# Patient Record
Sex: Female | Born: 1955 | Race: White | Hispanic: No
Health system: Southern US, Community
[De-identification: ages and names within clinical notes are randomized; demographics above are authoritative.]

---

## 2000-01-28 ENCOUNTER — Encounter: Payer: Self-pay | Admitting: Obstetrics and Gynecology

## 2000-01-28 ENCOUNTER — Encounter: Admission: RE | Admit: 2000-01-28 | Discharge: 2000-01-28 | Payer: Self-pay | Admitting: Obstetrics and Gynecology

## 2001-10-19 ENCOUNTER — Encounter: Payer: Self-pay | Admitting: Obstetrics and Gynecology

## 2001-10-19 ENCOUNTER — Encounter: Admission: RE | Admit: 2001-10-19 | Discharge: 2001-10-19 | Payer: Self-pay | Admitting: Obstetrics and Gynecology

## 2002-11-09 ENCOUNTER — Encounter: Payer: Self-pay | Admitting: Obstetrics and Gynecology

## 2002-11-09 ENCOUNTER — Encounter: Admission: RE | Admit: 2002-11-09 | Discharge: 2002-11-09 | Payer: Self-pay | Admitting: Obstetrics and Gynecology

## 2002-11-22 ENCOUNTER — Encounter: Payer: Self-pay | Admitting: Obstetrics and Gynecology

## 2002-11-22 ENCOUNTER — Encounter: Admission: RE | Admit: 2002-11-22 | Discharge: 2002-11-22 | Payer: Self-pay | Admitting: Obstetrics and Gynecology

## 2004-03-20 ENCOUNTER — Encounter: Admission: RE | Admit: 2004-03-20 | Discharge: 2004-03-20 | Payer: Self-pay | Admitting: Obstetrics and Gynecology

## 2006-02-16 ENCOUNTER — Ambulatory Visit: Payer: Self-pay | Admitting: General Practice

## 2006-02-24 ENCOUNTER — Ambulatory Visit: Payer: Self-pay | Admitting: General Practice

## 2007-03-23 ENCOUNTER — Encounter: Admission: RE | Admit: 2007-03-23 | Discharge: 2007-03-23 | Payer: Self-pay | Admitting: Obstetrics and Gynecology

## 2008-04-04 ENCOUNTER — Encounter: Admission: RE | Admit: 2008-04-04 | Discharge: 2008-04-04 | Payer: Self-pay | Admitting: Obstetrics and Gynecology

## 2009-06-05 ENCOUNTER — Encounter: Admission: RE | Admit: 2009-06-05 | Discharge: 2009-06-05 | Payer: Self-pay | Admitting: Obstetrics and Gynecology

## 2010-06-02 ENCOUNTER — Other Ambulatory Visit: Payer: Self-pay | Admitting: Obstetrics and Gynecology

## 2010-06-02 DIAGNOSIS — Z1231 Encounter for screening mammogram for malignant neoplasm of breast: Secondary | ICD-10-CM

## 2010-06-02 DIAGNOSIS — Z1239 Encounter for other screening for malignant neoplasm of breast: Secondary | ICD-10-CM

## 2010-06-11 ENCOUNTER — Ambulatory Visit: Payer: Self-pay

## 2010-06-25 ENCOUNTER — Ambulatory Visit
Admission: RE | Admit: 2010-06-25 | Discharge: 2010-06-25 | Disposition: A | Discharge status: Home / Self Care | Payer: BC Managed Care – PPO | Source: Ambulatory Visit | Attending: Obstetrics and Gynecology | Admitting: Obstetrics and Gynecology

## 2010-06-25 DIAGNOSIS — Z1231 Encounter for screening mammogram for malignant neoplasm of breast: Secondary | ICD-10-CM

## 2011-06-25 ENCOUNTER — Other Ambulatory Visit: Payer: Self-pay | Admitting: Obstetrics and Gynecology

## 2011-06-25 DIAGNOSIS — Z1231 Encounter for screening mammogram for malignant neoplasm of breast: Secondary | ICD-10-CM

## 2011-07-09 ENCOUNTER — Ambulatory Visit
Admission: RE | Admit: 2011-07-09 | Discharge: 2011-07-09 | Disposition: A | Discharge status: Home / Self Care | Payer: BC Managed Care – PPO | Source: Ambulatory Visit | Attending: Obstetrics and Gynecology | Admitting: Obstetrics and Gynecology

## 2011-07-09 DIAGNOSIS — Z1231 Encounter for screening mammogram for malignant neoplasm of breast: Secondary | ICD-10-CM

## 2012-06-22 ENCOUNTER — Other Ambulatory Visit: Payer: Self-pay | Admitting: Obstetrics and Gynecology

## 2012-06-22 DIAGNOSIS — Z1231 Encounter for screening mammogram for malignant neoplasm of breast: Secondary | ICD-10-CM

## 2012-07-20 ENCOUNTER — Ambulatory Visit
Admission: RE | Admit: 2012-07-20 | Discharge: 2012-07-20 | Disposition: A | Discharge status: Home / Self Care | Payer: PRIVATE HEALTH INSURANCE | Source: Ambulatory Visit | Attending: Obstetrics and Gynecology | Admitting: Obstetrics and Gynecology

## 2012-07-20 DIAGNOSIS — Z1231 Encounter for screening mammogram for malignant neoplasm of breast: Secondary | ICD-10-CM

## 2013-06-25 ENCOUNTER — Other Ambulatory Visit: Payer: Self-pay

## 2013-06-25 DIAGNOSIS — Z1231 Encounter for screening mammogram for malignant neoplasm of breast: Secondary | ICD-10-CM

## 2013-07-23 ENCOUNTER — Ambulatory Visit
Admission: RE | Admit: 2013-07-23 | Discharge: 2013-07-23 | Disposition: A | Discharge status: Home / Self Care | Payer: PRIVATE HEALTH INSURANCE | Source: Ambulatory Visit

## 2013-07-23 DIAGNOSIS — Z1231 Encounter for screening mammogram for malignant neoplasm of breast: Secondary | ICD-10-CM

## 2014-09-02 ENCOUNTER — Other Ambulatory Visit: Payer: Self-pay

## 2014-09-02 DIAGNOSIS — Z1231 Encounter for screening mammogram for malignant neoplasm of breast: Secondary | ICD-10-CM

## 2014-09-06 ENCOUNTER — Ambulatory Visit
Admission: RE | Admit: 2014-09-06 | Discharge: 2014-09-06 | Disposition: A | Discharge status: Home / Self Care | Payer: PRIVATE HEALTH INSURANCE | Source: Ambulatory Visit

## 2014-09-06 DIAGNOSIS — Z1231 Encounter for screening mammogram for malignant neoplasm of breast: Secondary | ICD-10-CM

## 2015-07-09 ENCOUNTER — Other Ambulatory Visit: Payer: Self-pay | Admitting: Family Medicine

## 2015-07-10 LAB — CMP12+LP+TP+TSH+6AC+CBC/D/PLT
ALBUMIN: 4.7 g/dL (ref 3.5–5.5)
ALT: 22 IU/L (ref 0–32)
AST: 14 IU/L (ref 0–40)
Albumin/Globulin Ratio: 2.2 (ref 1.1–2.5)
Alkaline Phosphatase: 82 IU/L (ref 39–117)
BASOS: 0 %
BUN/Creatinine Ratio: 15 (ref 9–23)
BUN: 11 mg/dL (ref 6–24)
Basophils Absolute: 0 10*3/uL (ref 0.0–0.2)
Bilirubin Total: 0.4 mg/dL (ref 0.0–1.2)
CALCIUM: 10 mg/dL (ref 8.7–10.2)
CHOLESTEROL TOTAL: 139 mg/dL (ref 100–199)
Chloride: 98 mmol/L (ref 96–106)
Chol/HDL Ratio: 3.4 ratio units (ref 0.0–4.4)
Creatinine, Ser: 0.74 mg/dL (ref 0.57–1.00)
EOS (ABSOLUTE): 0.2 10*3/uL (ref 0.0–0.4)
ESTIMATED CHD RISK: 0.5 times avg. (ref 0.0–1.0)
Eos: 2 %
FREE THYROXINE INDEX: 2.5 (ref 1.2–4.9)
GFR calc Af Amer: 103 mL/min/{1.73_m2} (ref >59)
GFR calc non Af Amer: 89 mL/min/{1.73_m2} (ref >59)
GGT: 32 IU/L (ref 0–60)
Globulin, Total: 2.1 g/dL (ref 1.5–4.5)
Glucose: 196 mg/dL — ABNORMAL HIGH (ref 65–99)
HDL: 41 mg/dL (ref >39)
HEMATOCRIT: 43.6 % (ref 34.0–46.6)
Hemoglobin: 15.1 g/dL (ref 11.1–15.9)
IMMATURE GRANULOCYTES: 0 %
IRON: 103 ug/dL (ref 27–159)
Immature Grans (Abs): 0 10*3/uL (ref 0.0–0.1)
LDH: 170 IU/L (ref 119–226)
LDL Calculated: 67 mg/dL (ref 0–99)
LYMPHS ABS: 2.2 10*3/uL (ref 0.7–3.1)
Lymphs: 31 %
MCH: 31.7 pg (ref 26.6–33.0)
MCHC: 34.6 g/dL (ref 31.5–35.7)
MCV: 92 fL (ref 79–97)
MONOS ABS: 0.5 10*3/uL (ref 0.1–0.9)
Monocytes: 7 %
NEUTROS PCT: 60 %
Neutrophils Absolute: 4.2 10*3/uL (ref 1.4–7.0)
PLATELETS: 205 10*3/uL (ref 150–379)
POTASSIUM: 4.5 mmol/L (ref 3.5–5.2)
Phosphorus: 3.9 mg/dL (ref 2.5–4.5)
RBC: 4.76 x10E6/uL (ref 3.77–5.28)
RDW: 13.3 % (ref 12.3–15.4)
SODIUM: 139 mmol/L (ref 134–144)
T3 UPTAKE RATIO: 29 % (ref 24–39)
T4 TOTAL: 8.7 ug/dL (ref 4.5–12.0)
TOTAL PROTEIN: 6.8 g/dL (ref 6.0–8.5)
TSH: 1.86 u[IU]/mL (ref 0.450–4.500)
Triglycerides: 157 mg/dL — ABNORMAL HIGH (ref 0–149)
Uric Acid: 4.2 mg/dL (ref 2.5–7.1)
VLDL Cholesterol Cal: 31 mg/dL (ref 5–40)
WBC: 7.1 10*3/uL (ref 3.4–10.8)

## 2015-07-10 LAB — HGB A1C W/O EAG: Hgb A1c MFr Bld: 8.6 % — ABNORMAL HIGH (ref 4.8–5.6)

## 2015-10-06 ENCOUNTER — Other Ambulatory Visit: Payer: Self-pay

## 2015-10-07 LAB — HGB A1C W/O EAG: Hgb A1c MFr Bld: 8.3 % — ABNORMAL HIGH (ref 4.8–5.6)

## 2015-11-14 ENCOUNTER — Other Ambulatory Visit: Payer: Self-pay | Admitting: Family Medicine

## 2015-11-15 LAB — CMP12+LP+TP+TSH+6AC+CBC/D/PLT
A/G RATIO: 2.3 — AB (ref 1.2–2.2)
ALBUMIN: 4.9 g/dL (ref 3.5–5.5)
ALK PHOS: 73 IU/L (ref 39–117)
ALT: 15 IU/L (ref 0–32)
AST: 18 IU/L (ref 0–40)
BASOS ABS: 0 10*3/uL (ref 0.0–0.2)
BASOS: 0 %
BILIRUBIN TOTAL: 0.5 mg/dL (ref 0.0–1.2)
BUN / CREAT RATIO: 26 — AB (ref 9–23)
BUN: 17 mg/dL (ref 6–24)
CHLORIDE: 100 mmol/L (ref 96–106)
CHOLESTEROL TOTAL: 133 mg/dL (ref 100–199)
Calcium: 9.7 mg/dL (ref 8.7–10.2)
Chol/HDL Ratio: 3.8 ratio units (ref 0.0–4.4)
Creatinine, Ser: 0.65 mg/dL (ref 0.57–1.00)
EOS (ABSOLUTE): 0.1 10*3/uL (ref 0.0–0.4)
EOS: 2 %
ESTIMATED CHD RISK: 0.7 times avg. (ref 0.0–1.0)
FREE THYROXINE INDEX: 2.8 (ref 1.2–4.9)
GFR calc Af Amer: 112 mL/min/{1.73_m2} (ref >59)
GFR calc non Af Amer: 97 mL/min/{1.73_m2} (ref >59)
GGT: 31 IU/L (ref 0–60)
Globulin, Total: 2.1 g/dL (ref 1.5–4.5)
Glucose: 163 mg/dL — ABNORMAL HIGH (ref 65–99)
HDL: 35 mg/dL — AB (ref >39)
HEMATOCRIT: 46.5 % (ref 34.0–46.6)
HEMOGLOBIN: 15.7 g/dL (ref 11.1–15.9)
IMMATURE GRANS (ABS): 0 10*3/uL (ref 0.0–0.1)
IMMATURE GRANULOCYTES: 0 %
IRON: 129 ug/dL (ref 27–159)
LDH: 161 IU/L (ref 119–226)
LDL CALC: 63 mg/dL (ref 0–99)
LYMPHS ABS: 1.8 10*3/uL (ref 0.7–3.1)
LYMPHS: 29 %
MCH: 30.9 pg (ref 26.6–33.0)
MCHC: 33.8 g/dL (ref 31.5–35.7)
MCV: 92 fL (ref 79–97)
MONOCYTES: 8 %
Monocytes Absolute: 0.5 10*3/uL (ref 0.1–0.9)
NEUTROS PCT: 61 %
Neutrophils Absolute: 3.9 10*3/uL (ref 1.4–7.0)
PLATELETS: 231 10*3/uL (ref 150–379)
Phosphorus: 3.7 mg/dL (ref 2.5–4.5)
Potassium: 4.2 mmol/L (ref 3.5–5.2)
RBC: 5.08 x10E6/uL (ref 3.77–5.28)
RDW: 13.4 % (ref 12.3–15.4)
SODIUM: 140 mmol/L (ref 134–144)
T3 UPTAKE RATIO: 31 % (ref 24–39)
T4, Total: 9 ug/dL (ref 4.5–12.0)
TRIGLYCERIDES: 173 mg/dL — AB (ref 0–149)
TSH: 1.35 u[IU]/mL (ref 0.450–4.500)
Total Protein: 7 g/dL (ref 6.0–8.5)
Uric Acid: 4.9 mg/dL (ref 2.5–7.1)
VLDL CHOLESTEROL CAL: 35 mg/dL (ref 5–40)
WBC: 6.3 10*3/uL (ref 3.4–10.8)

## 2015-11-15 LAB — HGB A1C W/O EAG: Hgb A1c MFr Bld: 7.4 % — ABNORMAL HIGH (ref 4.8–5.6)

## 2016-06-17 ENCOUNTER — Other Ambulatory Visit: Payer: Self-pay | Admitting: *Deleted

## 2016-06-17 DIAGNOSIS — E1165 Type 2 diabetes mellitus with hyperglycemia: Secondary | ICD-10-CM

## 2016-06-17 DIAGNOSIS — IMO0001 Reserved for inherently not codable concepts without codable children: Secondary | ICD-10-CM

## 2016-06-17 DIAGNOSIS — E785 Hyperlipidemia, unspecified: Secondary | ICD-10-CM

## 2016-06-17 DIAGNOSIS — G2581 Restless legs syndrome: Secondary | ICD-10-CM

## 2016-06-17 NOTE — Progress Notes (Signed)
Labs for PCP appt tomorrow. Will forward to PCP upon receipt.

## 2016-06-18 LAB — LIPID PANEL
CHOLESTEROL TOTAL: 165 mg/dL (ref 100–199)
Chol/HDL Ratio: 4 ratio units (ref 0.0–4.4)
HDL: 41 mg/dL (ref >39)
LDL Calculated: 88 mg/dL (ref 0–99)
TRIGLYCERIDES: 181 mg/dL — AB (ref 0–149)
VLDL CHOLESTEROL CAL: 36 mg/dL (ref 5–40)

## 2016-06-18 LAB — COMPREHENSIVE METABOLIC PANEL
ALBUMIN: 4.8 g/dL (ref 3.6–4.8)
ALK PHOS: 68 IU/L (ref 39–117)
ALT: 19 IU/L (ref 0–32)
AST: 13 IU/L (ref 0–40)
Albumin/Globulin Ratio: 2.5 — ABNORMAL HIGH (ref 1.2–2.2)
BILIRUBIN TOTAL: 0.5 mg/dL (ref 0.0–1.2)
BUN / CREAT RATIO: 21 (ref 12–28)
BUN: 14 mg/dL (ref 8–27)
CHLORIDE: 98 mmol/L (ref 96–106)
CO2: 20 mmol/L (ref 18–29)
CREATININE: 0.67 mg/dL (ref 0.57–1.00)
Calcium: 9.9 mg/dL (ref 8.7–10.3)
GFR calc Af Amer: 110 mL/min/{1.73_m2} (ref >59)
GFR calc non Af Amer: 96 mL/min/{1.73_m2} (ref >59)
GLUCOSE: 169 mg/dL — AB (ref 65–99)
Globulin, Total: 1.9 g/dL (ref 1.5–4.5)
Potassium: 4.3 mmol/L (ref 3.5–5.2)
Sodium: 142 mmol/L (ref 134–144)
Total Protein: 6.7 g/dL (ref 6.0–8.5)

## 2016-06-18 LAB — HGB A1C W/O EAG: HEMOGLOBIN A1C: 7.5 % — AB (ref 4.8–5.6)

## 2016-06-18 NOTE — Progress Notes (Addendum)
Labs routed to PCP for appt today.

## 2016-10-14 ENCOUNTER — Ambulatory Visit: Payer: Self-pay | Admitting: *Deleted

## 2016-10-14 VITALS — BP 136/82 | Ht 67.0 in | Wt 195.0 lb

## 2016-10-14 DIAGNOSIS — Z0000 Encounter for general adult medical examination without abnormal findings: Secondary | ICD-10-CM

## 2016-10-14 NOTE — Progress Notes (Signed)
[facility] insurance premium discount evaluation: Labs Drawn. Replacements ROI form signed. Tobacco Free Attestation form signed.  Forms placed in paper chart.  Scherrie GerlachFarziga discontinued 2/2 no coverage by insurance and high OOP costs. Stopped 3 weeks ago. Started Janumet XR 1-2 weeks ago. Checks CBGs periodically at home. Last 2 checks this week have been 260's. Previously was running in the 100's.

## 2016-10-15 LAB — CMP12+LP+TP+TSH+6AC+CBC/D/PLT
ALBUMIN: 4.4 g/dL (ref 3.6–4.8)
ALK PHOS: 88 IU/L (ref 39–117)
ALT: 21 IU/L (ref 0–32)
AST: 15 IU/L (ref 0–40)
Albumin/Globulin Ratio: 2.1 (ref 1.2–2.2)
BASOS: 1 %
BILIRUBIN TOTAL: 0.4 mg/dL (ref 0.0–1.2)
BUN / CREAT RATIO: 23 (ref 12–28)
BUN: 14 mg/dL (ref 8–27)
Basophils Absolute: 0 10*3/uL (ref 0.0–0.2)
CALCIUM: 9.4 mg/dL (ref 8.7–10.3)
CHLORIDE: 100 mmol/L (ref 96–106)
CHOL/HDL RATIO: 4 ratio (ref 0.0–4.4)
CHOLESTEROL TOTAL: 144 mg/dL (ref 100–199)
Creatinine, Ser: 0.61 mg/dL (ref 0.57–1.00)
EOS (ABSOLUTE): 0.2 10*3/uL (ref 0.0–0.4)
EOS: 3 %
Estimated CHD Risk: 0.9 times avg. (ref 0.0–1.0)
FREE THYROXINE INDEX: 2.6 (ref 1.2–4.9)
GFR calc non Af Amer: 99 mL/min/{1.73_m2} (ref >59)
GFR, EST AFRICAN AMERICAN: 114 mL/min/{1.73_m2} (ref >59)
GGT: 36 IU/L (ref 0–60)
GLOBULIN, TOTAL: 2.1 g/dL (ref 1.5–4.5)
Glucose: 238 mg/dL — ABNORMAL HIGH (ref 65–99)
HDL: 36 mg/dL — ABNORMAL LOW (ref >39)
HEMATOCRIT: 43.6 % (ref 34.0–46.6)
HEMOGLOBIN: 15.3 g/dL (ref 11.1–15.9)
IMMATURE GRANS (ABS): 0 10*3/uL (ref 0.0–0.1)
IMMATURE GRANULOCYTES: 1 %
Iron: 102 ug/dL (ref 27–159)
LDH: 188 IU/L (ref 119–226)
LDL CALC: 73 mg/dL (ref 0–99)
LYMPHS: 28 %
Lymphocytes Absolute: 1.8 10*3/uL (ref 0.7–3.1)
MCH: 31.7 pg (ref 26.6–33.0)
MCHC: 35.1 g/dL (ref 31.5–35.7)
MCV: 90 fL (ref 79–97)
MONOCYTES: 8 %
MONOS ABS: 0.5 10*3/uL (ref 0.1–0.9)
NEUTROS ABS: 4.1 10*3/uL (ref 1.4–7.0)
NEUTROS PCT: 59 %
POTASSIUM: 4.3 mmol/L (ref 3.5–5.2)
Phosphorus: 2.9 mg/dL (ref 2.5–4.5)
Platelets: 227 10*3/uL (ref 150–379)
RBC: 4.83 x10E6/uL (ref 3.77–5.28)
RDW: 13.1 % (ref 12.3–15.4)
SODIUM: 140 mmol/L (ref 134–144)
T3 Uptake Ratio: 29 % (ref 24–39)
T4, Total: 9 ug/dL (ref 4.5–12.0)
TRIGLYCERIDES: 177 mg/dL — AB (ref 0–149)
TSH: 2.43 u[IU]/mL (ref 0.450–4.500)
Total Protein: 6.5 g/dL (ref 6.0–8.5)
Uric Acid: 4.4 mg/dL (ref 2.5–7.1)
VLDL CHOLESTEROL CAL: 35 mg/dL (ref 5–40)
WBC: 6.6 10*3/uL (ref 3.4–10.8)

## 2016-10-15 LAB — HGB A1C W/O EAG: Hgb A1c MFr Bld: 9.8 % — ABNORMAL HIGH (ref 4.8–5.6)

## 2016-10-15 NOTE — Progress Notes (Signed)
Results reviewed with pt. Diet modifications discussed at length regarding A1c, glucose, triglyceride improvements. Pt has appt later this month with pcp. Results routed to pcp today.

## 2016-10-18 NOTE — Progress Notes (Signed)
See previous RN notes. Off DM2 po meds 2/2 insurance noncoverage and switching. Without med ?1 week-[id number], no clear answer given. On new Janumet XR 2-3 weeks now. No illness or steroids. +poor diet. Be well appt moved up from assigned birth month to accommodate for pcp appt next week. All issues discussed at length with pt.

## 2017-09-08 ENCOUNTER — Other Ambulatory Visit: Payer: Self-pay | Admitting: *Deleted

## 2017-09-08 DIAGNOSIS — E785 Hyperlipidemia, unspecified: Secondary | ICD-10-CM

## 2017-09-08 DIAGNOSIS — E1165 Type 2 diabetes mellitus with hyperglycemia: Secondary | ICD-10-CM

## 2017-09-08 DIAGNOSIS — G2581 Restless legs syndrome: Secondary | ICD-10-CM

## 2017-09-08 NOTE — Progress Notes (Signed)
Orders in advance of scheduled OV 09/12/17 with pcp in paper chart. For cost effeciency, CBC with diff, CMET, Lipid panel, TSH, A1c ordered as Exec panel + A1c.

## 2017-09-09 LAB — CMP12+LP+TP+TSH+6AC+CBC/D/PLT
ALT: 13 IU/L (ref 0–32)
AST: 15 IU/L (ref 0–40)
Albumin/Globulin Ratio: 2.3 — ABNORMAL HIGH (ref 1.2–2.2)
Albumin: 4.5 g/dL (ref 3.6–4.8)
Alkaline Phosphatase: 80 IU/L (ref 39–117)
BASOS: 0 %
BUN / CREAT RATIO: 17 (ref 12–28)
BUN: 12 mg/dL (ref 8–27)
Basophils Absolute: 0 10*3/uL (ref 0.0–0.2)
Bilirubin Total: 0.3 mg/dL (ref 0.0–1.2)
CHLORIDE: 101 mmol/L (ref 96–106)
CHOL/HDL RATIO: 7.5 ratio — AB (ref 0.0–4.4)
Calcium: 9.5 mg/dL (ref 8.7–10.3)
Cholesterol, Total: 194 mg/dL (ref 100–199)
Creatinine, Ser: 0.69 mg/dL (ref 0.57–1.00)
EOS (ABSOLUTE): 0.1 10*3/uL (ref 0.0–0.4)
Eos: 2 %
Estimated CHD Risk: 2.1 times avg. — ABNORMAL HIGH (ref 0.0–1.0)
Free Thyroxine Index: 2.4 (ref 1.2–4.9)
GFR calc non Af Amer: 94 mL/min/{1.73_m2} (ref >59)
GFR, EST AFRICAN AMERICAN: 109 mL/min/{1.73_m2} (ref >59)
GGT: 35 IU/L (ref 0–60)
GLOBULIN, TOTAL: 2 g/dL (ref 1.5–4.5)
Glucose: 225 mg/dL — ABNORMAL HIGH (ref 65–99)
HDL: 26 mg/dL — ABNORMAL LOW (ref >39)
HEMATOCRIT: 45.3 % (ref 34.0–46.6)
Hemoglobin: 15.9 g/dL (ref 11.1–15.9)
Immature Grans (Abs): 0.1 10*3/uL (ref 0.0–0.1)
Immature Granulocytes: 1 %
Iron: 117 ug/dL (ref 27–139)
LDH: 146 IU/L (ref 119–226)
Lymphocytes Absolute: 2 10*3/uL (ref 0.7–3.1)
Lymphs: 28 %
MCH: 32.7 pg (ref 26.6–33.0)
MCHC: 35.1 g/dL (ref 31.5–35.7)
MCV: 93 fL (ref 79–97)
MONOS ABS: 0.5 10*3/uL (ref 0.1–0.9)
Monocytes: 7 %
NEUTROS ABS: 4.4 10*3/uL (ref 1.4–7.0)
Neutrophils: 62 %
PHOSPHORUS: 4.8 mg/dL — AB (ref 2.5–4.5)
Platelets: 243 10*3/uL (ref 150–379)
Potassium: 4 mmol/L (ref 3.5–5.2)
RBC: 4.86 x10E6/uL (ref 3.77–5.28)
RDW: 13.7 % (ref 12.3–15.4)
Sodium: 141 mmol/L (ref 134–144)
T3 Uptake Ratio: 28 % (ref 24–39)
T4 TOTAL: 8.4 ug/dL (ref 4.5–12.0)
TRIGLYCERIDES: 737 mg/dL — AB (ref 0–149)
TSH: 2.01 u[IU]/mL (ref 0.450–4.500)
Total Protein: 6.5 g/dL (ref 6.0–8.5)
URIC ACID: 5.1 mg/dL (ref 2.5–7.1)
WBC: 7 10*3/uL (ref 3.4–10.8)

## 2017-09-09 LAB — HGB A1C W/O EAG: Hgb A1c MFr Bld: 10.2 % — ABNORMAL HIGH (ref 4.8–5.6)

## 2017-09-09 NOTE — Addendum Note (Signed)
Addended by: Loraine Grip on: 09/09/2017 11:56 AM   Modules accepted: Orders

## 2017-09-12 ENCOUNTER — Other Ambulatory Visit (HOSPITAL_COMMUNITY): Payer: Self-pay | Admitting: Pulmonary Disease

## 2017-09-12 ENCOUNTER — Ambulatory Visit (HOSPITAL_COMMUNITY)
Admission: RE | Admit: 2017-09-12 | Discharge: 2017-09-12 | Disposition: A | Discharge status: Home / Self Care | Payer: PRIVATE HEALTH INSURANCE | Source: Ambulatory Visit | Attending: Pulmonary Disease | Admitting: Pulmonary Disease

## 2017-09-12 DIAGNOSIS — M5137 Other intervertebral disc degeneration, lumbosacral region: Secondary | ICD-10-CM | POA: Diagnosis not present

## 2017-09-12 DIAGNOSIS — M5432 Sciatica, left side: Principal | ICD-10-CM

## 2017-09-12 DIAGNOSIS — M25551 Pain in right hip: Secondary | ICD-10-CM

## 2017-09-12 DIAGNOSIS — M5431 Sciatica, right side: Secondary | ICD-10-CM | POA: Diagnosis present

## 2017-09-12 DIAGNOSIS — I700 Atherosclerosis of aorta: Secondary | ICD-10-CM | POA: Insufficient documentation

## 2017-09-12 NOTE — Progress Notes (Signed)
Late entry: Results reviewed with pt 09/09/17 @ 1000. Glucose elevated similar to previous. Phosphorus newly elevated. Pt endorses drinking 1.5-2 20oz sodas (Pepsi) a day now. Triglycerides substantially increased from previous. LDL now unable to be calculated. HDL continuing to decrease. CHD risk elevated now. A1c worsened from previous.   Pt reports she expected A1c and cholesterol to be somewhat worse than previous as she has not been following any kind of low carb /sugar or diabetic diet. Eating high sugar, high fat foods over past few months, more often than previously discussed 11months ago when [facility] Well labs were drawn and A1c had made >2pt increase then. She reports her significant other was admitted a few weeks ago to [facility] for COPD/resp distress indefinitely and she does not believe he will survive more than a few more weeks, or get to come home. This has caused her stress and she has been eating whatever she can whenever she has a chance to because she is at the hospital often.  Medication list reconciled. She was on [place] prior to [facility] Well labs in June 2018. She reports this med worked this best for her with keeping her blood sugar most controlled. Insurance began denying this med and it sounds as if a prior authorization needed to be completed by pcp according to what pt reports today. However, this was not done and instead pt was off any DM2 med for 1 week-1 month per this RN's previous notes at that time, and pt was switched to Janumet XR. She sts this was not controlling blood sugar well either and also began having coverage issues with this and cost was too high, so pcp switched her to Invokana  daily. She is unsure how long she has been taking this. She reports she takes it, along with her Simvastatin, on a regular basis, rarely missing doses.  Greater than discussion with patient regarding medication coverage through their insurance, prior authorization process if this needs  to be done by pcp to restart [place] as she would like to do, manufacturer coupons available for low to no copay for many DM2 meds and how to sign up, and diet and exercise recommendations for DM2. Handout emailed to patient as requested detailing carbs and recommended servings of fruits, along with info on The Plate Method, example of day's meals for DM2, and general DM2 info. Exercise at least 1101min/week moderate intensity. Follow up with pcp as scheduled on Monday 5/13. Results routed to pcp as requested by pt. Copy of results provided to pt. RN requested pt f/u with RN in clinic after pcp appt to provide update on any medication changes, future or add-on lab orders, other recommendations. Pt verbalizes understanding and agreement.

## 2017-09-13 NOTE — Progress Notes (Signed)
Noted.  RN Rolly Salter to follow up with patient today regarding PCM appt date.

## 2018-01-09 ENCOUNTER — Ambulatory Visit: Payer: Self-pay | Admitting: *Deleted

## 2018-01-09 DIAGNOSIS — E118 Type 2 diabetes mellitus with unspecified complications: Secondary | ICD-10-CM

## 2018-01-09 DIAGNOSIS — Z0000 Encounter for general adult medical examination without abnormal findings: Secondary | ICD-10-CM

## 2018-01-09 NOTE — Progress Notes (Signed)
Labs per pcp order. Order in paper chart.

## 2018-01-10 LAB — COMPREHENSIVE METABOLIC PANEL
A/G RATIO: 2.5 — AB (ref 1.2–2.2)
ALT: 20 IU/L (ref 0–32)
AST: 18 IU/L (ref 0–40)
Albumin: 4.7 g/dL (ref 3.6–4.8)
Alkaline Phosphatase: 75 IU/L (ref 39–117)
BUN/Creatinine Ratio: 22 (ref 12–28)
BUN: 15 mg/dL (ref 8–27)
Bilirubin Total: 0.4 mg/dL (ref 0.0–1.2)
CALCIUM: 9.4 mg/dL (ref 8.7–10.3)
CO2: 21 mmol/L (ref 20–29)
Chloride: 100 mmol/L (ref 96–106)
Creatinine, Ser: 0.68 mg/dL (ref 0.57–1.00)
GFR calc Af Amer: 108 mL/min/{1.73_m2} (ref >59)
GFR, EST NON AFRICAN AMERICAN: 94 mL/min/{1.73_m2} (ref >59)
GLUCOSE: 208 mg/dL — AB (ref 65–99)
Globulin, Total: 1.9 g/dL (ref 1.5–4.5)
POTASSIUM: 4.3 mmol/L (ref 3.5–5.2)
Sodium: 138 mmol/L (ref 134–144)
Total Protein: 6.6 g/dL (ref 6.0–8.5)

## 2018-01-10 LAB — CBC WITH DIFFERENTIAL/PLATELET
Basophils Absolute: 0.1 x10E3/uL (ref 0.0–0.2)
Basos: 1 %
EOS (ABSOLUTE): 0.2 x10E3/uL (ref 0.0–0.4)
Eos: 2 %
Hematocrit: 43.4 % (ref 34.0–46.6)
Hemoglobin: 15.6 g/dL (ref 11.1–15.9)
Immature Grans (Abs): 0.1 x10E3/uL (ref 0.0–0.1)
Immature Granulocytes: 1 %
Lymphocytes Absolute: 1.8 x10E3/uL (ref 0.7–3.1)
Lymphs: 21 %
MCH: 32.9 pg (ref 26.6–33.0)
MCHC: 35.9 g/dL — ABNORMAL HIGH (ref 31.5–35.7)
MCV: 92 fL (ref 79–97)
Monocytes Absolute: 0.6 x10E3/uL (ref 0.1–0.9)
Monocytes: 7 %
Neutrophils Absolute: 6 x10E3/uL (ref 1.4–7.0)
Neutrophils: 68 %
Platelets: 231 x10E3/uL (ref 150–450)
RBC: 4.74 x10E6/uL (ref 3.77–5.28)
RDW: 12.7 % (ref 12.3–15.4)
WBC: 8.6 x10E3/uL (ref 3.4–10.8)

## 2018-01-10 LAB — LIPID PANEL
Chol/HDL Ratio: 5.4 ratio — ABNORMAL HIGH (ref 0.0–4.4)
Cholesterol, Total: 158 mg/dL (ref 100–199)
HDL: 29 mg/dL — ABNORMAL LOW (ref >39)
Triglycerides: 538 mg/dL — ABNORMAL HIGH (ref 0–149)

## 2018-01-10 LAB — HGB A1C W/O EAG: Hgb A1c MFr Bld: 10.1 % — ABNORMAL HIGH (ref 4.8–5.6)

## 2018-01-17 ENCOUNTER — Ambulatory Visit: Payer: Self-pay | Admitting: Registered Nurse

## 2018-01-17 ENCOUNTER — Encounter: Payer: Self-pay | Admitting: Registered Nurse

## 2018-01-17 VITALS — BP 142/92 | HR 98 | Temp 98.8°F | Resp 16

## 2018-01-17 DIAGNOSIS — B028 Zoster with other complications: Secondary | ICD-10-CM

## 2018-01-17 DIAGNOSIS — E119 Type 2 diabetes mellitus without complications: Secondary | ICD-10-CM | POA: Insufficient documentation

## 2018-01-17 MED ORDER — VALACYCLOVIR HCL 1 G PO TABS: 1000.0000 mg | ORAL_TABLET | Freq: Three times a day (TID) | ORAL | 0 refills | Status: AC

## 2018-01-17 MED ORDER — CEPHALEXIN 500 MG PO CAPS: 500.0000 mg | ORAL_CAPSULE | Freq: Two times a day (BID) | ORAL | 0 refills | Status: AC

## 2018-01-17 NOTE — Patient Instructions (Signed)
Cellulitis, Adult Cellulitis is a skin infection. The infected area is usually red and tender. This condition occurs most often in the arms and lower legs. The infection can travel to the muscles, blood, and underlying tissue and become serious. It is very important to get treated for this condition. What are the causes? Cellulitis is caused by bacteria. The bacteria enter through a break in the skin, such as a cut, burn, insect bite, open sore, or crack. What increases the risk? This condition is more likely to occur in people who:  Have a weak defense system (immune system).  Have open wounds on the skin such as cuts, burns, bites, and scrapes. Bacteria can enter the body through these open wounds.  Are older.  Have diabetes.  Have a type of long-lasting (chronic) liver disease (cirrhosis) or kidney disease.  Use IV drugs.  What are the signs or symptoms? Symptoms of this condition include:  Redness, streaking, or spotting on the skin.  Swollen area of the skin.  Tenderness or pain when an area of the skin is touched.  Warm skin.  Fever.  Chills.  Blisters.  How is this diagnosed? This condition is diagnosed based on a medical history and physical exam. You may also have tests, including:  Blood tests.  Lab tests.  Imaging tests.  How is this treated? Treatment for this condition may include:  Medicines, such as antibiotic medicines or antihistamines.  Supportive care, such as rest and application of cold or warm cloths (cold or warm compresses) to the skin.  Hospital care, if the condition is severe.  The infection usually gets better within 1-2 days of treatment. Follow these instructions at home:  Take over-the-counter and prescription medicines only as told by your health care provider.  If you were prescribed an antibiotic medicine, take it as told by your health care provider. Do not stop taking the antibiotic even if you start to feel  better.  Drink enough fluid to keep your urine clear or pale yellow.  Do not touch or rub the infected area.  Raise (elevate) the infected area above the level of your heart while you are sitting or lying down.  Apply warm or cold compresses to the affected area as told by your health care provider.  Keep all follow-up visits as told by your health care provider. This is important. These visits let your health care provider make sure a more serious infection is not developing. Contact a health care provider if:  You have a fever.  Your symptoms do not improve within 1-2 days of starting treatment.  Your bone or joint underneath the infected area becomes painful after the skin has healed.  Your infection returns in the same area or another area.  You notice a swollen bump in the infected area.  You develop new symptoms.  You have a general ill feeling (malaise) with muscle aches and pains. Get help right away if:  Your symptoms get worse.  You feel very sleepy.  You develop vomiting or diarrhea that persists.  You notice red streaks coming from the infected area.  Your red area gets larger or turns dark in color. This information is not intended to replace advice given to you by your health care provider. Make sure you discuss any questions you have with your health care provider. Document Released: 01/27/2005 Document Revised: 08/28/2015 Document Reviewed: 02/26/2015 Elsevier Interactive Patient Education  2018 [employer]. Shingles Shingles, which is also known as herpes zoster, is  an infection that causes a painful skin rash and fluid-filled blisters. Shingles is not related to genital herpes, which is a sexually transmitted infection. Shingles only develops in people who:  Have had chickenpox.  Have received the chickenpox vaccine. (This is rare.)  What are the causes? Shingles is caused by varicella-zoster virus (VZV). This is the same virus that causes  chickenpox. After exposure to VZV, the virus stays in the body in an inactive (dormant) state. Shingles develops if the virus reactivates. This can happen many years after the initial exposure to VZV. It is not known what causes this virus to reactivate. What increases the risk? People who have had chickenpox or received the chickenpox vaccine are at risk for shingles. Infection is more common in people who:  Are older than age 62.  Have a weakened defense (immune) system, such as those with HIV, AIDS, or cancer.  Are taking medicines that weaken the immune system, such as transplant medicines.  Are under great stress.  What are the signs or symptoms? Early symptoms of this condition include itching, tingling, and pain in an area on your skin. Pain may be described as burning, stabbing, or throbbing. A few days or weeks after symptoms start, a painful red rash appears, usually on one side of the body in a bandlike or beltlike pattern. The rash eventually turns into fluid-filled blisters that break open, scab over, and dry up in about 2-3 weeks. At any time during the infection, you may also develop:  A fever.  Chills.  A headache.  An upset stomach.  How is this diagnosed? This condition is diagnosed with a skin exam. Sometimes, skin or fluid samples are taken from the blisters before a diagnosis is made. These samples are examined under a microscope or sent to a lab for testing. How is this treated? There is no specific cure for this condition. Your health care provider will probably prescribe medicines to help you manage pain, recover more quickly, and avoid long-term problems. Medicines may include:  Antiviral drugs.  Anti-inflammatory drugs.  Pain medicines.  If the area involved is on your face, you may be referred to a specialist, such as an eye doctor (ophthalmologist) or an ear, nose, and throat (ENT) doctor to help you avoid eye problems, chronic pain, or  disability. Follow these instructions at home: Medicines  Take medicines only as directed by your health care provider.  Apply an anti-itch or numbing cream to the affected area as directed by your health care provider. Blister and Rash Care  Take a cool bath or apply cool compresses to the area of the rash or blisters as directed by your health care provider. This may help with pain and itching.  Keep your rash covered with a loose bandage (dressing). Wear loose-fitting clothing to help ease the pain of material rubbing against the rash.  Keep your rash and blisters clean with mild soap and cool water or as directed by your health care provider.  Check your rash every day for signs of infection. These include redness, swelling, and pain that lasts or increases.  Do not pick your blisters.  Do not scratch your rash. General instructions  Rest as directed by your health care provider.  Keep all follow-up visits as directed by your health care provider. This is important.  Until your blisters scab over, your infection can cause chickenpox in people who have never had it or been vaccinated against it. To prevent this from happening, avoid contact  with other people, especially: ? Babies. ? Pregnant women. ? Children who have eczema. ? Elderly people who have transplants. ? People who have chronic illnesses, such as leukemia or AIDS. Contact a health care provider if:  Your pain is not relieved with prescribed medicines.  Your pain does not get better after the rash heals.  Your rash looks infected. Signs of infection include redness, swelling, and pain that lasts or increases. Get help right away if:  The rash is on your face or nose.  You have facial pain, pain around your eye area, or loss of feeling on one side of your face.  You have ear pain or you have ringing in your ear.  You have loss of taste.  Your condition gets worse. This information is not intended to  replace advice given to you by your health care provider. Make sure you discuss any questions you have with your health care provider. Document Released: 04/19/2005 Document Revised: 12/14/2015 Document Reviewed: 02/28/2014 Elsevier Interactive Patient Education  2018 [employer].

## 2018-01-17 NOTE — Progress Notes (Signed)
Subjective:    Patient ID: Sara Weaver, female    DOB: 09/22/1955, 62 y.o.   MRN: [id number]  62y/o Caucasian established female pt c/o wound to L posterior flank that she noticed with a pimple like appearance/feel about one week ago. Site has central nodule with induration approx the size of a pea with surrounding erythema approx 10cm Monday per RN Nance Pew  Patient reports history of chicken pox as child increased recent stress never received shingles vaccine PCM had given her Rx a couple years ago but did not complete.  Siblings with history of shingles outbreak.  Rash started under left breast at bra line a few days later.  ?insect bite per patient back  Skin started hurting first and since she recently started ozempic injections this month she thought it was a possible side effect  Skin hurting on upper torso not at injection site Feeling a little run down   Denied history of seizures     Review of Systems  Constitutional: Positive for fatigue. Negative for activity change, appetite change, chills, diaphoresis and fever.  HENT: Negative for trouble swallowing and voice change.   Eyes: Negative for photophobia and visual disturbance.  Respiratory: Negative for cough, choking, chest tightness, shortness of breath, wheezing and stridor.   Cardiovascular: Negative for chest pain and palpitations.  Gastrointestinal: Negative for diarrhea, nausea and vomiting.  Endocrine: Negative for cold intolerance and heat intolerance.  Genitourinary: Negative for difficulty urinating and dysuria.  Musculoskeletal: Negative for arthralgias, back pain, gait problem, joint swelling, myalgias, neck pain and neck stiffness.  Skin: Positive for color change, rash and wound. Negative for pallor.  Allergic/Immunologic: Positive for immunocompromised state. Negative for environmental allergies and food allergies.  Neurological: Negative for dizziness, tremors, seizures, syncope, facial asymmetry, speech  difficulty, weakness, light-headedness, numbness and headaches.  Hematological: Negative for adenopathy. Does not bruise/bleed easily.  Psychiatric/Behavioral: Negative for agitation, confusion and sleep disturbance.       Objective:   Physical Exam  Constitutional: She is oriented to person, place, and time. Vital signs are normal. She appears well-developed and well-nourished. She is active and cooperative.  Non-toxic appearance. She does not have a sickly appearance. She does not appear ill. No distress.  HENT:  Head: Normocephalic and atraumatic.  Right Ear: Hearing and external ear normal.  Left Ear: Hearing and external ear normal.  Nose: Nose normal. Right sinus exhibits no maxillary sinus tenderness and no frontal sinus tenderness. Left sinus exhibits no maxillary sinus tenderness and no frontal sinus tenderness.  Mouth/Throat: Uvula is midline, oropharynx is clear and moist and mucous membranes are normal. No oropharyngeal exudate. No tonsillar exudate.  Eyes: Pupils are equal, round, and reactive to light. Conjunctivae, EOM and lids are normal. Right eye exhibits no discharge. Left eye exhibits no discharge. Right conjunctiva is not injected. Right conjunctiva has no hemorrhage. Left conjunctiva is not injected. Left conjunctiva has no hemorrhage. No scleral icterus.  Neck: Trachea normal, normal range of motion and phonation normal. Neck supple. No tracheal tenderness present. No tracheal deviation present.  Cardiovascular: Normal rate, regular rhythm, normal heart sounds and intact distal pulses.  Pulses:      Radial pulses are 2+ on the right side, and 2+ on the left side.  Pulmonary/Chest: Effort normal and breath sounds normal. No stridor. She has no decreased breath sounds. She has no wheezes. She has no rhonchi. She has no rales.  Abdominal: Soft. Normal appearance. She exhibits no distension, no fluid wave  and no ascites. There is no rigidity and no guarding.  Musculoskeletal:  Normal range of motion. She exhibits edema and tenderness. She exhibits no deformity.       Right shoulder: Normal.       Left shoulder: Normal.       Right elbow: Normal.      Left elbow: Normal.       Right hip: Normal.       Left hip: Normal.       Right knee: Normal.       Left knee: Normal.       Right ankle: Normal.       Left ankle: Normal.       Cervical back: Normal.       Thoracic back: She exhibits tenderness, swelling and pain. She exhibits normal range of motion, no bony tenderness, no edema, no deformity, no laceration and no spasm.       Lumbar back: Normal.       Back:       Right hand: Normal.       Left hand: Normal.  T6 dermatome with nonpitting edema/induration posteriorlateral surrounding papule with scab on top induration 1.5cm with erythema streaking diagonally 7cm; full arom c/t/l-spine  Lymphadenopathy:       Head (right side): No submental, no submandibular, no tonsillar, no preauricular, no posterior auricular and no occipital adenopathy present.       Head (left side): No submental, no submandibular, no tonsillar, no preauricular, no posterior auricular and no occipital adenopathy present.    She has no cervical adenopathy.       Right cervical: No superficial cervical, no deep cervical and no posterior cervical adenopathy present.      Left cervical: No superficial cervical, no deep cervical and no posterior cervical adenopathy present.  Neurological: She is alert and oriented to person, place, and time. She has normal strength. She is not disoriented. She displays no atrophy and no tremor. No cranial nerve deficit or sensory deficit. She exhibits normal muscle tone. She displays no seizure activity. Coordination and gait normal. GCS eye subscore is 4. GCS verbal subscore is 5. GCS motor subscore is 6.  On/off exam table/in/out of chair without difficulty; gait sure and steady in hallway  Skin: Skin is warm and dry. Capillary refill takes less than 2 seconds.  Abrasion and rash noted. No bruising, no burn, no ecchymosis, no laceration, no lesion, no petechiae and no purpura noted. Rash is macular, papular, maculopapular, nodular and vesicular. Rash is not pustular and not urticarial. She is not diaphoretic. There is erythema. No cyanosis. No pallor. Nails show no clubbing.     Psychiatric: She has a normal mood and affect. Her speech is normal and behavior is normal. Judgment and thought content normal. She is not actively hallucinating. Cognition and memory are normal. She is attentive.  Nursing note and vitals reviewed.   Cleansed papule/abrasion left flank with alcohol, wound spray then applied triple antibiotic and bandaid in exam room.  Dressing clean dry and intact on ambulatory discharge from clinic in NAD.      Assessment & Plan:  A-shingles with cellulitis initial outbreak  P-  Electrolytes and kidney function WNL last check 09 Jan 2018  On invokana it may increase valacylovir levels  Avoid dehydration Valacyclovir 1000mg  po TID x 7 days may restart at first onset of symptoms #42 RF0 Electronic Rx to her pharmacy of choice.  May appy ice 15 minutes QID prn pain/swelling.  Avoid dehydration.  [facility] once this outbreak resolved.  Discussed did not see rash torso as usual side effect ozempic not at injection site but to discuss with her doctor next visit regarding shingles outbreak.  Patient denied history of seizures.  Follow up Thursday re-evaluation if worsening pain/rash discussed should be improving after 48 hours of keflex and shingles may take longer since rash not within 48 hours of outbreak and starting valacyclovir.  Discussed with patient that typically stress/illness caused immunosuppression and reactivation of herpes zoster virus that is the same as chickenpox. typically will have discomfort and then rash appears on affected dermatome usually unilateral. Discussed with patient typically not contagious to  others unless exposed to discharge from rash to keep covered when out in public. Do not scratch lesions as secondary infection may develop (skin infection). It is best to start antiviral therapy at first sign of infection/outbreak. Valacyclovir 1000mg  po TID x 7 days and given enough pills to restart with second outbreak if it occurs. Patient has norco and taking gabapentin so that is why she may not be having as severe symptoms as her sisters as these are sometimes adjunct medications for shingles pain  Discussed to switch to camisole as less clothing rubbing on vesicles under breast left and continue no underwire until rash resolved.  May take OTC acetaminophen 1000mg  po QID  Prn pain given 8 UD from clinic stock.  Given 4 UD triple antibiotic and bandaids to apply to pimple on back BID after shower with soap.  Change bandaid prn soiling. Exitcare handout on shingles and cellulitis printed and given to patient.  Follow up with  PCM if no improvement pain, fever, spreading erythema or purulent discharge for re-evaluation. Patient verbalized understanding of information/instructions, agreed with plan of care and had no further questions at this time    Apply triple antitiobic to back pimple BID after washing area with soap and water.  Keflex 500mg  po BID x 7 days #30 RF0 dispensed from [facility] to patient.  Exitcare handout on skin infection given to patient. RTC if worsening erythema, pain, purulent discharge, fever should be improving after taking 48 hours of keflex. Wash towels, washcloths, sheets in hot water with bleach every couple of days until infection resolved. Patient verbalized understanding, agreed with plan of care and had no further questions at this time.  Exitcare handout on skin infection printed and given to patient

## 2018-03-17 ENCOUNTER — Telehealth: Payer: Self-pay | Admitting: *Deleted

## 2018-03-17 NOTE — Telephone Encounter (Signed)
Pt requesting labs to be drawn prior to appt she has with pcp next week. Last labs drawn 01/09/18. Advised pt would likely be too soon for labs to be repeated but would send request to pcp for his recommended labs if any. Letter routed to pcp office.

## 2018-03-20 ENCOUNTER — Ambulatory Visit: Payer: Self-pay | Admitting: *Deleted

## 2018-03-20 DIAGNOSIS — E118 Type 2 diabetes mellitus with unspecified complications: Secondary | ICD-10-CM

## 2018-03-20 NOTE — Progress Notes (Signed)
Saw pcp this morning. Faxed order from pcp requesting A1c. MD was made aware prior to order approval that last A1c was drawn 01/09/18. Faxed order placed in paper chart.

## 2018-03-21 LAB — HGB A1C W/O EAG: Hgb A1c MFr Bld: 8.3 % — ABNORMAL HIGH (ref 4.8–5.6)

## 2018-03-21 NOTE — Progress Notes (Signed)
Spoke with pt via phone. Congratulated her on lowered A1c, although it is still elevated. She has been taking Ozempic for approx 1.5-2 months now. Advised continue healthy dietary choices r/t DM2 management and wt management. Exercise 150/min/wk. Results routed to pcp/ordering provider.

## 2018-04-11 ENCOUNTER — Ambulatory Visit: Payer: Self-pay

## 2018-04-11 DIAGNOSIS — Z23 Encounter for immunization: Secondary | ICD-10-CM

## 2018-06-21 ENCOUNTER — Other Ambulatory Visit (HOSPITAL_COMMUNITY): Payer: Self-pay | Admitting: Pulmonary Disease

## 2018-06-21 ENCOUNTER — Other Ambulatory Visit: Payer: Self-pay | Admitting: Pulmonary Disease

## 2018-06-21 DIAGNOSIS — R110 Nausea: Secondary | ICD-10-CM

## 2018-07-03 ENCOUNTER — Ambulatory Visit (HOSPITAL_COMMUNITY)
Admission: RE | Admit: 2018-07-03 | Discharge: 2018-07-03 | Disposition: A | Discharge status: Home / Self Care | Payer: PRIVATE HEALTH INSURANCE | Source: Ambulatory Visit | Attending: Pulmonary Disease | Admitting: Pulmonary Disease

## 2018-07-03 DIAGNOSIS — R110 Nausea: Secondary | ICD-10-CM | POA: Insufficient documentation

## 2018-07-28 ENCOUNTER — Other Ambulatory Visit: Payer: Self-pay

## 2018-07-28 ENCOUNTER — Ambulatory Visit: Payer: Self-pay | Admitting: *Deleted

## 2018-07-28 VITALS — BP 130/86 | HR 76 | Ht 66.0 in | Wt 172.0 lb

## 2018-07-28 DIAGNOSIS — Z8639 Personal history of other endocrine, nutritional and metabolic disease: Secondary | ICD-10-CM

## 2018-07-28 DIAGNOSIS — Z0000 Encounter for general adult medical examination without abnormal findings: Secondary | ICD-10-CM

## 2018-07-28 NOTE — Progress Notes (Signed)
[facility] insurance premium discount evaluation: Labs Drawn. Replacements ROI form signed. Tobacco Free Attestation form signed.  Forms placed in paper chart. Okay to route results to pcp per pt. 

## 2018-07-29 LAB — CMP12+LP+TP+TSH+6AC+CBC/D/PLT
ALT: 17 IU/L (ref 0–32)
AST: 16 IU/L (ref 0–40)
Albumin/Globulin Ratio: 2 (ref 1.2–2.2)
Albumin: 4.7 g/dL (ref 3.8–4.8)
Alkaline Phosphatase: 68 IU/L (ref 39–117)
BASOS ABS: 0.1 10*3/uL (ref 0.0–0.2)
BILIRUBIN TOTAL: 0.5 mg/dL (ref 0.0–1.2)
BUN/Creatinine Ratio: 28 (ref 12–28)
BUN: 19 mg/dL (ref 8–27)
Basos: 1 %
CALCIUM: 9.6 mg/dL (ref 8.7–10.3)
CHLORIDE: 97 mmol/L (ref 96–106)
CHOLESTEROL TOTAL: 155 mg/dL (ref 100–199)
Chol/HDL Ratio: 4.8 ratio — ABNORMAL HIGH (ref 0.0–4.4)
Creatinine, Ser: 0.67 mg/dL (ref 0.57–1.00)
EOS (ABSOLUTE): 0.1 10*3/uL (ref 0.0–0.4)
EOS: 2 %
Estimated CHD Risk: 1.3 times avg. — ABNORMAL HIGH (ref 0.0–1.0)
FREE THYROXINE INDEX: 2.9 (ref 1.2–4.9)
GFR calc Af Amer: 109 mL/min/{1.73_m2} (ref >59)
GFR calc non Af Amer: 95 mL/min/{1.73_m2} (ref >59)
GGT: 34 IU/L (ref 0–60)
GLUCOSE: 182 mg/dL — AB (ref 65–99)
Globulin, Total: 2.4 g/dL (ref 1.5–4.5)
HDL: 32 mg/dL — AB (ref >39)
HEMOGLOBIN: 16 g/dL — AB (ref 11.1–15.9)
Hematocrit: 42.8 % (ref 34.0–46.6)
IMMATURE GRANS (ABS): 0 10*3/uL (ref 0.0–0.1)
IMMATURE GRANULOCYTES: 1 %
Iron: 105 ug/dL (ref 27–139)
LDH: 178 IU/L (ref 119–226)
LDL CALC: 69 mg/dL (ref 0–99)
Lymphocytes Absolute: 1.8 10*3/uL (ref 0.7–3.1)
Lymphs: 28 %
MCH: 32.4 pg (ref 26.6–33.0)
MCHC: 37.4 g/dL — ABNORMAL HIGH (ref 31.5–35.7)
MCV: 87 fL (ref 79–97)
MONOCYTES: 7 %
Monocytes Absolute: 0.4 10*3/uL (ref 0.1–0.9)
NEUTROS PCT: 61 %
Neutrophils Absolute: 3.9 10*3/uL (ref 1.4–7.0)
PHOSPHORUS: 4.1 mg/dL (ref 3.0–4.3)
PLATELETS: 221 10*3/uL (ref 150–450)
Potassium: 4.1 mmol/L (ref 3.5–5.2)
RBC: 4.94 x10E6/uL (ref 3.77–5.28)
RDW: 11.9 % (ref 11.7–15.4)
Sodium: 138 mmol/L (ref 134–144)
T3 Uptake Ratio: 31 % (ref 24–39)
T4, Total: 9.4 ug/dL (ref 4.5–12.0)
TSH: 1.6 u[IU]/mL (ref 0.450–4.500)
Total Protein: 7.1 g/dL (ref 6.0–8.5)
Triglycerides: 269 mg/dL — ABNORMAL HIGH (ref 0–149)
URIC ACID: 4.5 mg/dL (ref 2.5–7.1)
VLDL CHOLESTEROL CAL: 54 mg/dL — AB (ref 5–40)
WBC: 6.3 10*3/uL (ref 3.4–10.8)

## 2018-07-29 LAB — VITAMIN D 25 HYDROXY (VIT D DEFICIENCY, FRACTURES): Vit D, 25-Hydroxy: 23.8 ng/mL — ABNORMAL LOW (ref 30.0–100.0)

## 2018-07-29 LAB — HGB A1C W/O EAG: Hgb A1c MFr Bld: 8.4 % — ABNORMAL HIGH (ref 4.8–5.6)

## 2018-07-31 NOTE — Progress Notes (Signed)
noted 

## 2019-01-22 ENCOUNTER — Telehealth: Payer: Self-pay | Admitting: *Deleted

## 2019-01-22 NOTE — Telephone Encounter (Signed)
Pt requests A1c be drawn ahead of her appt with pcp next week. Letter routed to [employer] requesting orders. Will inform pt once response is received and schedule lab draw if ordered.

## 2019-05-10 IMAGING — CR DG LUMBAR SPINE COMPLETE 4+V
5 series · 5 of 5 positions shown · non-contrast
Comparison: None.

CLINICAL DATA: Lower back and right hip pain without known injury.

EXAM:
LUMBAR SPINE - COMPLETE 4+ VIEW

[t lumbar spine ap]
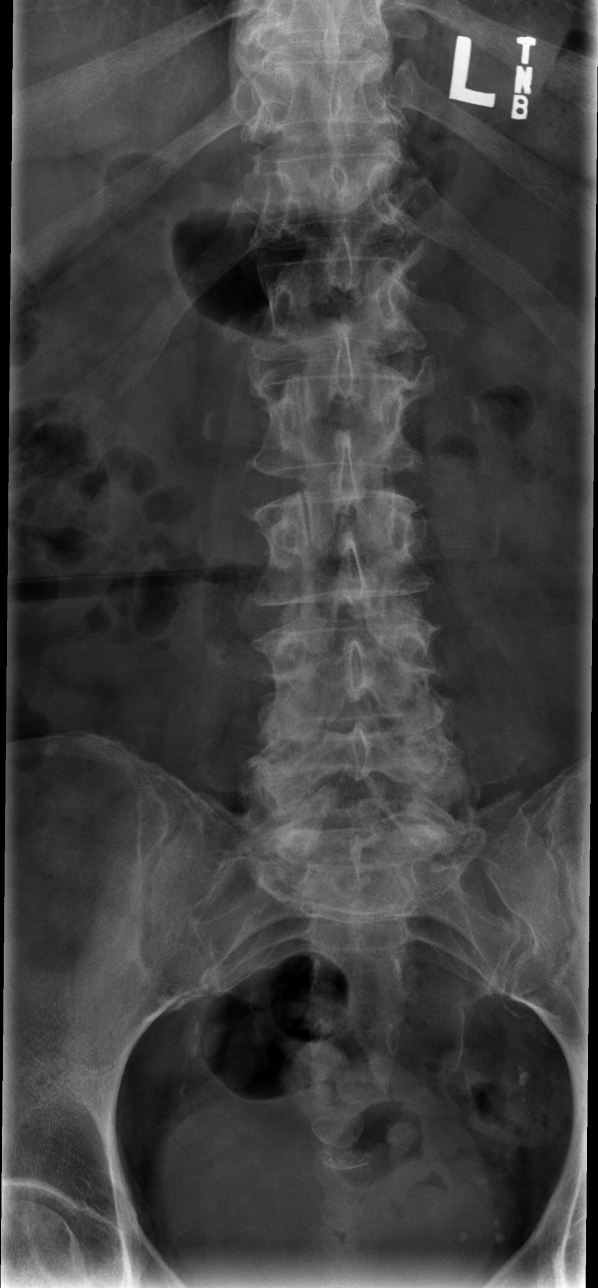

[t lumbar spine obl (1 of 2)]
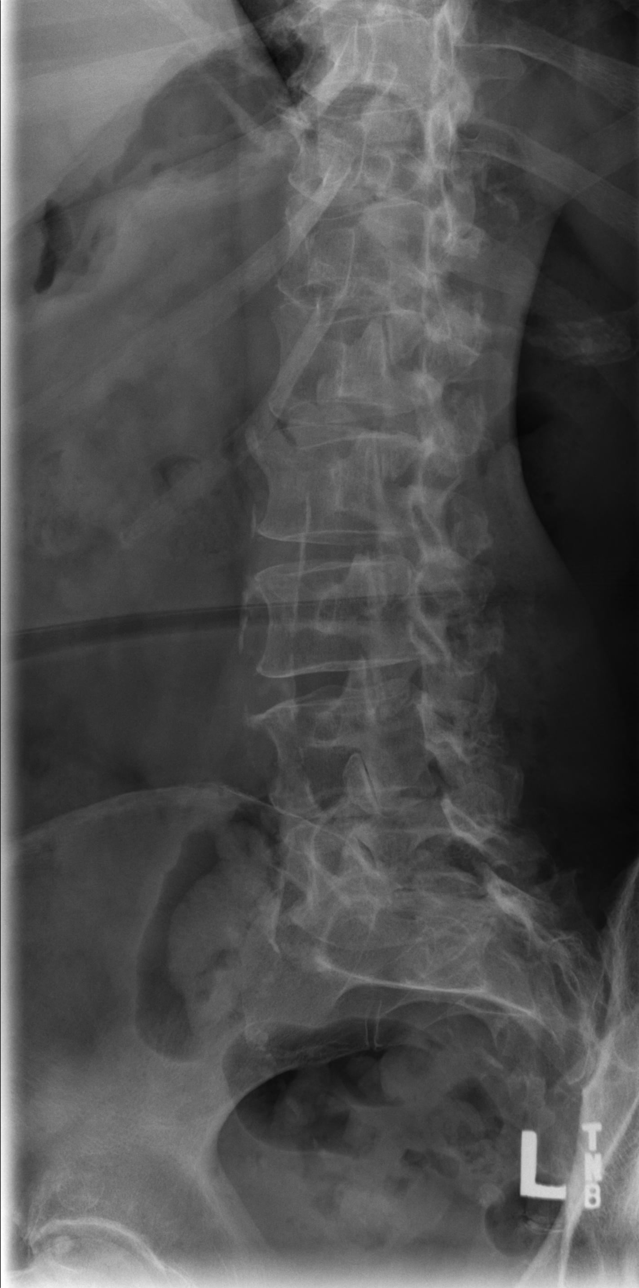

[t lumbar spine obl (2 of 2)]
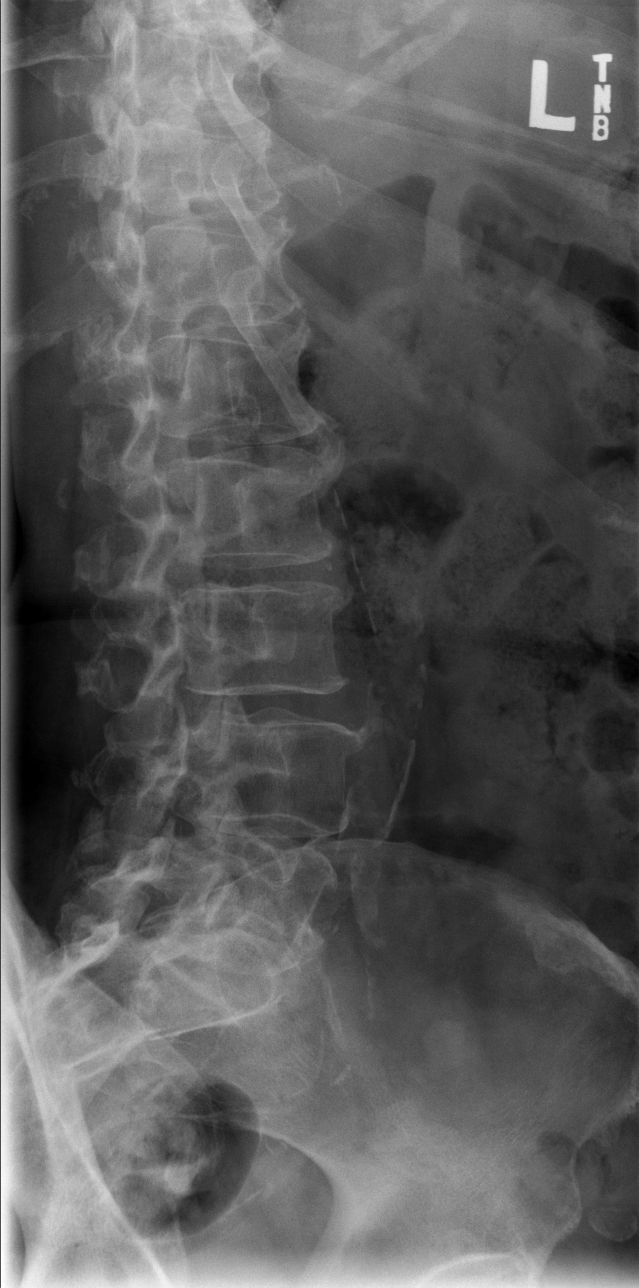

[t lumbar spine lat]
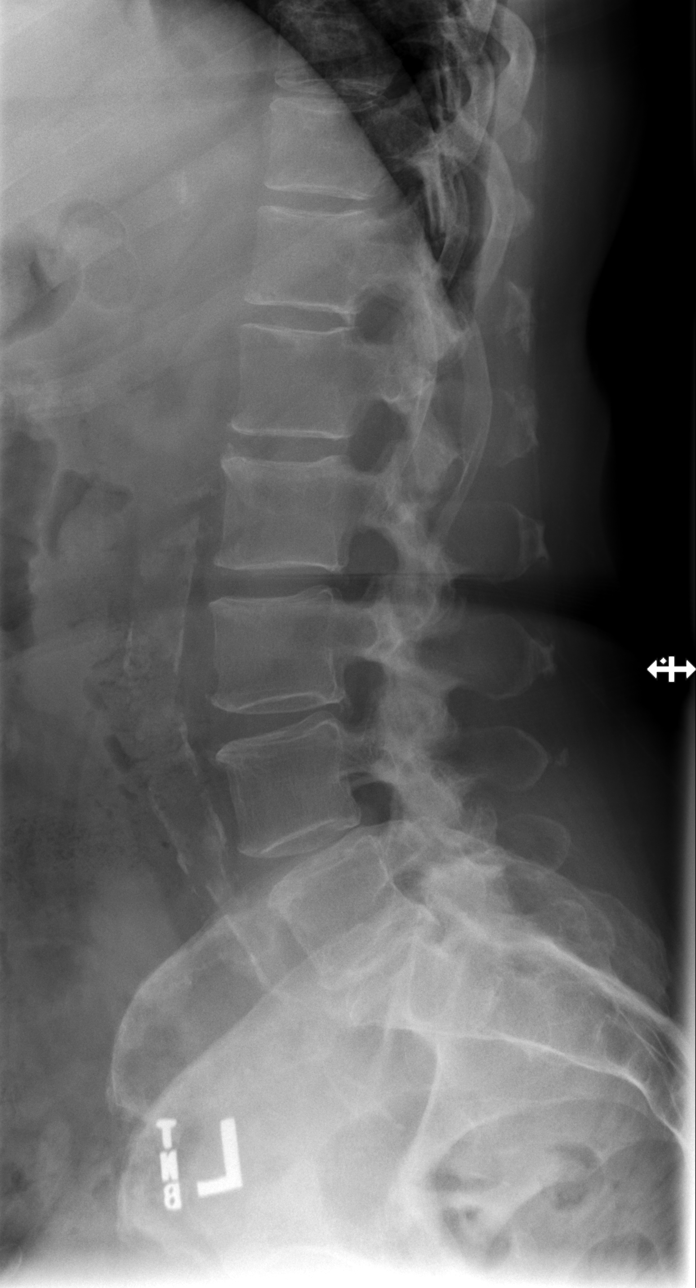

[t lumbar l-5 s-1 spot]
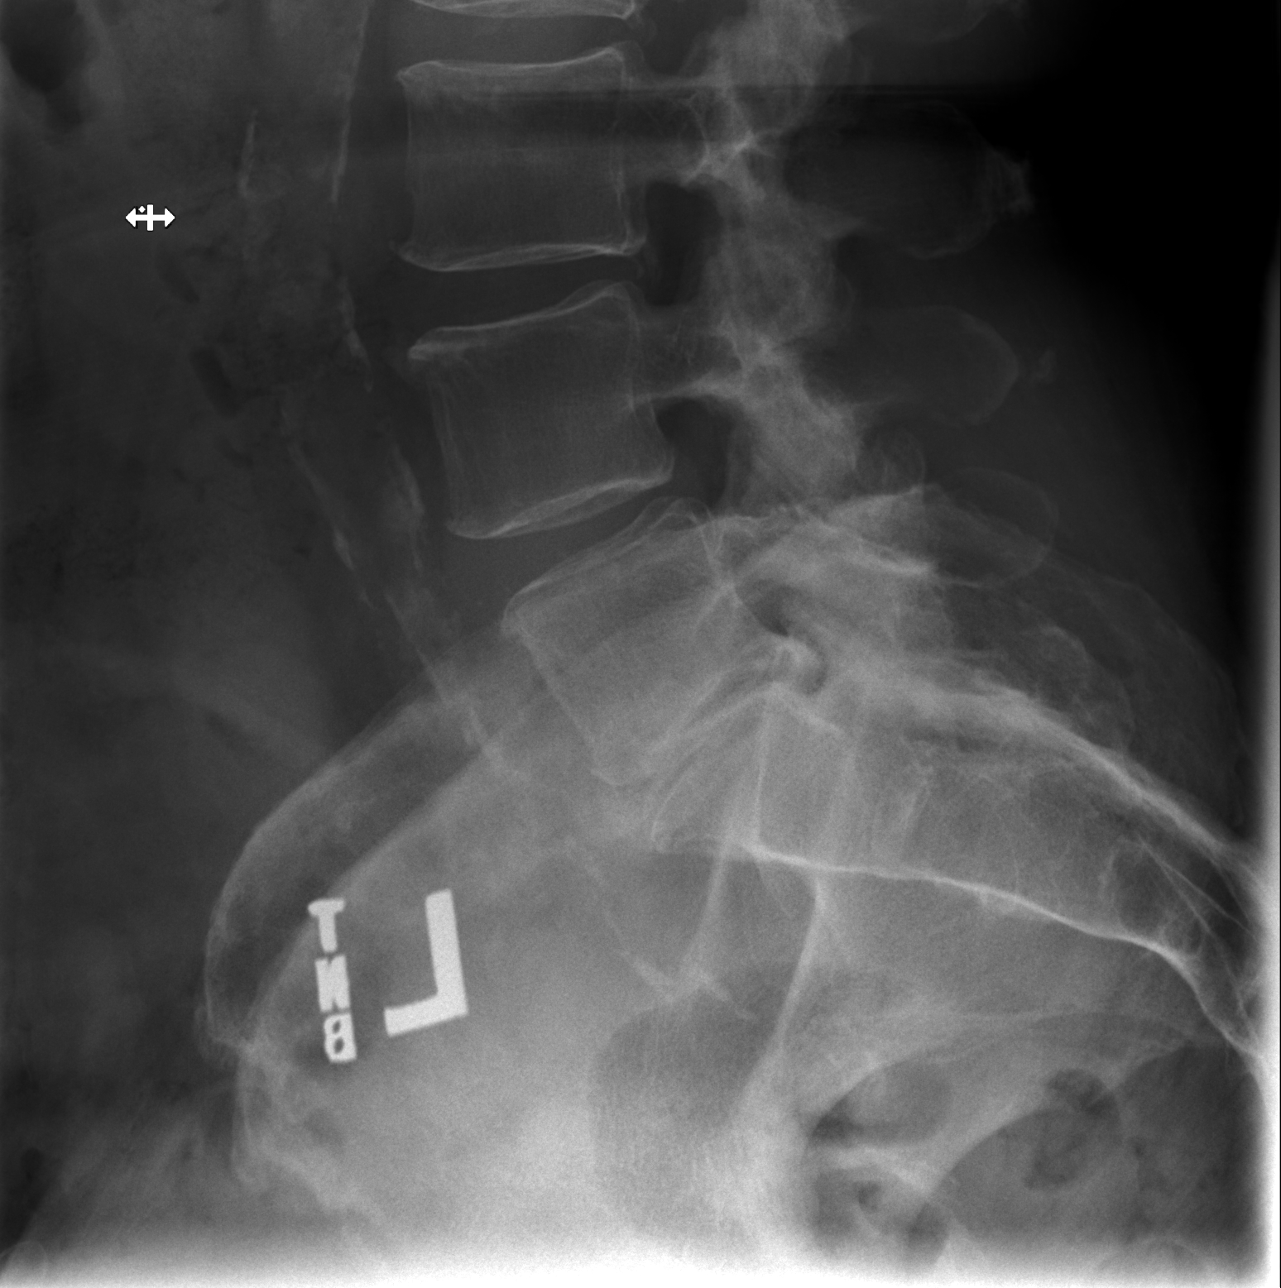

[5 of 5 positions shown; findings below may reference images not displayed]

FINDINGS: No fracture or spondylolisthesis is noted. Moderate degenerative
disc disease is noted at L5-S1. Minimal anterior osteophyte
formation is noted at all levels. Atherosclerosis of thoracic aorta
is noted. Mild degenerative changes are seen involving posterior
facet joints of L4-5.
IMPRESSION: Moderate degenerative disc disease is noted at L5-S1. No acute
abnormality seen in the lumbar spine.

Aortic Atherosclerosis ([id number].T).

## 2019-08-27 ENCOUNTER — Ambulatory Visit: Payer: PRIVATE HEALTH INSURANCE | Attending: Internal Medicine

## 2019-08-27 ENCOUNTER — Other Ambulatory Visit: Payer: Self-pay

## 2019-08-27 ENCOUNTER — Telehealth: Payer: Self-pay | Admitting: *Deleted

## 2019-08-27 DIAGNOSIS — Z20822 Contact with and (suspected) exposure to covid-19: Secondary | ICD-10-CM

## 2019-08-27 NOTE — Telephone Encounter (Signed)
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   RN spoke with pt by phone and found that pt spent time outside last weekend began having rhinorrhea, throat scratchiness/frequent throat clearing on Wed 4/21. This worsened for a couple of days and started improving over this weekend. She has not been taking any allergy meds this week and typically doesn't. She lets allergies usually run their course without OTC treatment. Currently with rhinorrhea, productive cough with clear mucus, mild chest congestion. Due to no home treatment and Day 5 of sx now, still present though improving, testing appt made for 2:45p today at the [facility] in [place]. Reviewed drive up testing instructions and directions with pt, ie attend appt time, remain in vehicle, wear mask, results back in 2-3 days.  Advised pt that Day 1 was 4/22. Will f/u pt on 4/29 with anticipated results. If sx improving and no fever in previous >24hrs without antipyretics, pt to complete quarantine 5/1 and return to work on next scheduled workday of 5/3.    Reviewed possible Covid sx including cough, ShOB, sinusitis sx, sore throat, fever/chills, body aches, fatigue, loss of taste/smell, GI sx n/v/d. Also reviewed same day/emergent eval/ER precautions of dizziness/syncope, confusion, blue tint to lips/face, severe ShOB/difficulty breathing.    Pt verbalizes understanding and agreement with plan of care. No further questions/concerns at this time. Pt reminded to contact clinic with any changes in sx or questions/concerns.

## 2019-08-27 NOTE — Telephone Encounter (Signed)
Noted agreed with plan of care 

## 2019-08-28 LAB — SARS-COV-2, NAA 2 DAY TAT

## 2019-08-28 LAB — NOVEL CORONAVIRUS, NAA: SARS-CoV-2, NAA: NOT DETECTED

## 2019-08-28 NOTE — Telephone Encounter (Signed)
Spoke with pt by phone. Gave her negative Covid test result. Sx continue unchanged today. Productive cough with clear phlegm. Advised her again to start OTC allergy treatment of antihistamine of choice, Flonase, nasal saline spray as mist and rinse, BID showering, and mucinex for chest congestion. Pt verbalizes understanding and agreement. Denies any further questions/concerns. Plan to f/u with pt on 4/30, complete quarantine 5/1 and RTW 5/3.

## 2019-08-28 NOTE — Telephone Encounter (Signed)
Noted agreed with plan of care 

## 2019-09-05 NOTE — Telephone Encounter (Signed)
Please verify with patient if symptoms resolved and returned to work as expected

## 2019-09-14 NOTE — Telephone Encounter (Signed)
All sx resolved and pt RTW as planned on Monday 09/03/19. No current needs/concerns.

## 2019-09-17 ENCOUNTER — Encounter: Payer: Self-pay | Admitting: *Deleted

## 2019-09-17 NOTE — Telephone Encounter (Signed)
Noted symptoms resolved and returned to work 09/03/2019 after home quarantine and covid testing

## 2020-05-01 ENCOUNTER — Other Ambulatory Visit: Payer: Self-pay

## 2020-05-01 ENCOUNTER — Encounter: Payer: Self-pay | Admitting: Registered Nurse

## 2020-05-01 ENCOUNTER — Ambulatory Visit: Payer: Self-pay | Admitting: Registered Nurse

## 2020-05-01 VITALS — BP 159/80 | HR 96 | Temp 98.3°F | Resp 16

## 2020-05-01 DIAGNOSIS — M5136 Other intervertebral disc degeneration, lumbar region: Secondary | ICD-10-CM

## 2020-05-01 DIAGNOSIS — M461 Sacroiliitis, not elsewhere classified: Secondary | ICD-10-CM

## 2020-05-01 DIAGNOSIS — M62830 Muscle spasm of back: Secondary | ICD-10-CM

## 2020-05-01 DIAGNOSIS — R141 Gas pain: Secondary | ICD-10-CM | POA: Insufficient documentation

## 2020-05-01 MED ORDER — CYCLOBENZAPRINE HCL 10 MG PO TABS: 5.0000 mg | ORAL_TABLET | Freq: Three times a day (TID) | ORAL | 0 refills | Status: AC | PRN

## 2020-05-01 MED ORDER — PREDNISONE 10 MG PO TABS: 20.0000 mg | ORAL_TABLET | Freq: Every day | ORAL | 0 refills | Status: AC

## 2020-05-01 MED ORDER — BIOFREEZE 4 % EX GEL: 1.0000 "application " | Freq: Four times a day (QID) | CUTANEOUS | 0 refills | Status: AC | PRN

## 2020-05-01 NOTE — Progress Notes (Signed)
Subjective:    Patient ID: Sara Weaver, female    DOB: [birth year], 64 y.o.   MRN: [id number]  64y/o Caucasian established female pt c/o R sided low back pain that extends into R buttock and posterior upper leg. Flared up 2.5 weeks ago. No home treatment. Feels like pain is worst in the morning and improves throughout the day. Taking her norco, sees pain management.  Has used flexeril in the past with good results.  Denied previous xrays/back problems/loss of bowel bladder control, saddle paresthesias or arm/leg weakness.  Sleeping okay.  "I think I have sciatica"     Review of Systems  Constitutional: Negative for activity change, appetite change, chills, diaphoresis, fatigue and fever.  HENT: Negative for trouble swallowing and voice change.   Eyes: Negative for photophobia and visual disturbance.  Respiratory: Negative for cough, shortness of breath, wheezing and stridor.   Cardiovascular: Negative for leg swelling.  Gastrointestinal: Negative for diarrhea, nausea and vomiting.  Endocrine: Negative for cold intolerance and heat intolerance.  Genitourinary: Negative for difficulty urinating and enuresis.  Musculoskeletal: Positive for arthralgias, back pain and myalgias. Negative for gait problem, joint swelling, neck pain and neck stiffness.  Skin: Negative for color change and rash.  Allergic/Immunologic: Negative for food allergies.  Neurological: Positive for numbness. Negative for dizziness, tremors, seizures, syncope, facial asymmetry, speech difficulty, weakness, light-headedness and headaches.  Hematological: Negative for adenopathy. Does not bruise/bleed easily.  Psychiatric/Behavioral: Negative for agitation, confusion and sleep disturbance.       Objective:   Physical Exam Vitals and nursing note reviewed.  Constitutional:      General: She is awake. She is not in acute distress.Vital signs are normal.     Appearance: Normal appearance. She is well-developed,  normal weight and well-nourished. She is not ill-appearing, toxic-appearing, sickly-appearing or diaphoretic.  HENT:     Head: Normocephalic and atraumatic.     Jaw: There is normal jaw occlusion.     Right Ear: Hearing and external ear normal.     Left Ear: Hearing and external ear normal.     Nose: Nose normal. No congestion or rhinorrhea.     Mouth/Throat:     Mouth: Oropharynx is clear and moist and mucous membranes are normal. No angioedema.     Pharynx: Oropharynx is clear.  Eyes:     General: Lids are normal. Vision grossly intact. Gaze aligned appropriately. Allergic shiner present. No visual field deficit or scleral icterus.       Right eye: No discharge.        Left eye: No discharge.     Extraocular Movements: Extraocular movements intact and EOM normal.     Conjunctiva/sclera: Conjunctivae normal.     Pupils: Pupils are equal, round, and reactive to light.  Neck:     Thyroid: No thyromegaly.     Vascular: No JVD.     Trachea: Trachea and phonation normal. No tracheal deviation.  Cardiovascular:     Rate and Rhythm: Normal rate and regular rhythm.     Pulses: Normal pulses and intact distal pulses.          Radial pulses are 2+ on the right side and 2+ on the left side.  Pulmonary:     Effort: Pulmonary effort is normal. No respiratory distress.     Breath sounds: Normal breath sounds and air entry. No stridor or transmitted upper airway sounds. No wheezing.     Comments: Spoke full sentences without difficulty; wearing mask due  to covid 19 pandemic; no cough observed in exam room Abdominal:     Palpations: Abdomen is soft.     Tenderness: There is no abdominal tenderness.  Musculoskeletal:        General: Tenderness present. No swelling, deformity, signs of injury or edema.     Right shoulder: Normal.     Left shoulder: Normal.     Right elbow: Normal.     Left elbow: Normal.     Right hand: Normal.     Left hand: Normal.     Cervical back: Normal range of motion  and neck supple. No swelling, edema, deformity, erythema, signs of trauma, lacerations, rigidity, spasms, tenderness, bony tenderness or crepitus. No pain with movement, spinous process tenderness or muscular tenderness. Normal range of motion. Normal.     Thoracic back: Normal. No swelling, edema, deformity, signs of trauma, lacerations, spasms, tenderness or bony tenderness. Normal range of motion. No scoliosis.     Lumbar back: Pain, tenderness and bony tenderness present. No swelling, edema, deformity, signs of trauma, lacerations or spasms. Normal range of motion. Normal pulse. Negative right straight leg raise test and negative left straight leg raise test. No scoliosis.     Right hip: Tenderness and bony tenderness present. No deformity, lacerations or crepitus. Decreased range of motion. Normal strength.     Left hip: No swelling, deformity, lacerations, tenderness, bony tenderness or crepitus. Normal range of motion. Normal strength.     Right upper leg: Normal. No swelling, edema, deformity, lacerations, tenderness or bony tenderness.     Left upper leg: Normal. No swelling, edema, deformity, lacerations, tenderness or bony tenderness.     Right knee: Normal.     Left knee: Normal.     Right lower leg: Normal. No edema.     Left lower leg: Normal. No edema.     Right ankle: Normal.     Left ankle: Normal.     Comments: Standing to sitting to supine without difficulty; slow to reverse on exam table; able to touch shins with forward flexion; no worsening of symptoms with extension/lateral bending/rotation; right hip abduction 15 degrees less than left due to pain; right SI joint TTP  Lymphadenopathy:     Head:     Right side of head: No submental or preauricular adenopathy.     Left side of head: No submental or preauricular adenopathy.     Cervical: No cervical adenopathy.     Right cervical: No superficial cervical adenopathy.    Left cervical: No superficial cervical adenopathy.   Skin:    General: Skin is warm and dry.     Capillary Refill: Capillary refill takes less than 2 seconds.     Coloration: Skin is not ashen, cyanotic, jaundiced, mottled, pale or sallow.     Findings: No abrasion, abscess, acne, bruising, burn, ecchymosis, erythema, signs of injury, laceration, lesion, petechiae, rash or wound.     Nails: There is no clubbing or cyanosis.  Neurological:     General: No focal deficit present.     Mental Status: She is alert and oriented to person, place, and time. Mental status is at baseline. She is not disoriented.     GCS: GCS eye subscore is 4. GCS verbal subscore is 5. GCS motor subscore is 6.     Cranial Nerves: Cranial nerves are intact. No cranial nerve deficit, dysarthria or facial asymmetry.     Sensory: Sensation is intact. No sensory deficit.     Motor:  Motor function is intact. No weakness, tremor, atrophy, abnormal muscle tone or seizure activity.     Coordination: Coordination is intact. Coordination normal.     Gait: Gait is intact. Gait normal.     Deep Tendon Reflexes: Strength normal. Reflexes normal.     Reflex Scores:      Brachioradialis reflexes are 2+ on the right side and 2+ on the left side.      Patellar reflexes are 2+ on the right side and 2+ on the left side.    Comments: Strength 5/5 bilaterally arms/legs; gait sure and steady in clinic; in/out of chair and on/off exam table without difficulty  Psychiatric:        Attention and Perception: Attention and perception normal. She is attentive.        Mood and Affect: Mood and affect, mood and affect normal.        Speech: Speech normal.        Behavior: Behavior normal. Behavior is not actively hallucinating. Behavior is cooperative.        Thought Content: Thought content normal.        Cognition and Memory: Cognition, memory and cognition and memory normal.        Judgment: Judgment normal.     CLINICAL DATA:  Lower back and right hip pain without known  injury.  EXAM: LUMBAR SPINE - COMPLETE 4+ VIEW  COMPARISON:  None.  FINDINGS: No fracture or spondylolisthesis is noted. Moderate degenerative disc disease is noted at L5-S1. Minimal anterior osteophyte formation is noted at all levels. Atherosclerosis of thoracic aorta is noted. Mild degenerative changes are seen involving posterior facet joints of L4-5.  IMPRESSION: Moderate degenerative disc disease is noted at L5-S1. No acute abnormality seen in the lumbar spine.  Aortic Atherosclerosis (ICD10-I70.0).   Electronically Signed   By: Lupita RaiderJames  Green Jr, M.D.   On: 09/12/2017 16:09       Assessment & Plan:  A-back muscle spasms; right SI joint inflammation, degenerative disc disease  P-Discussed xrays showed arthritis and degenerative disc disease 2019.  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  Patient reported she is able to perform at this time but would follow up if situation changes.  Heat/gentle arom/tylenol 1000mg  po q6h prn pain most helpful.  Consider epsom salt soak.  Biofreeze gel topical QID prn pain.  Discussed with patient must separate administration valium and norco and flexeril by 2 hours as each can cause drowsiness.  cyclobenazeprine/flexeril 10mg  sig take 1/2 tab po TID prn muscle spasms #30 RF0 dispensed from [facility].   Avoid alcohol intake and driving while taking cyclobenazeprine/flexeril as drowsiness common side effect.  Slow position changes as medication also lower blood pressure.  Home stretches demonstrated to patient-e.g. t/l spine AROM flexion/extension/lateral bending/rotation, knee to chest and rock side to side on back. Self massage or professional prn, foam roller use or tennis/racquetball.  Heat/cryotherapy 15 minutes QID prn.  Trial thermacare 1  applied from clinic stock. Follow up with pain management provider and notify [facility] and given biofreeze/flexeril/prednisone pulse and stretches.  Prednisone 10mg  sig take 2 po with breakfast x 4 days. Consider physical therapy referral if no improvement [facility] and/or chiropractic care.  Ensure ergonomics correct desk at work avoid repetitive  motions if possible/holding phone/laptop in hand use desk/stand and/or break up lifting items into smaller loads/weights.  Patient was instructed to rest, ice, and ROM exercises.  Activity as tolerated.   Follow up if symptoms persist or worsen especially if loss of bowel/bladder control, arm/leg weakness and/or saddle paresthesias.  Exitcare handouts printed and given on degenerative disk disease, radicular pain, muscle spasms and lumbar strain rehab exercises.  Patient verbalized agreement and understanding of treatment plan and had no further questions at this time.  P2:  Injury Prevention and Fitness.

## 2020-05-01 NOTE — Patient Instructions (Addendum)
Radicular Pain Radicular pain is a type of pain that spreads from your back or neck along a spinal nerve. Spinal nerves are nerves that leave the spinal cord and go to the muscles. Radicular pain is sometimes called radiculopathy, radiculitis, or a pinched nerve. When you have this type of pain, you may also have weakness, numbness, or tingling in the area of your body that is supplied by the nerve. The pain may feel sharp and burning. Depending on which spinal nerve is affected, the pain may occur in the:  Neck area (cervical radicular pain). You may also feel pain, numbness, weakness, or tingling in the arms.  Mid-spine area (thoracic radicular pain). You would feel this pain in the back and chest. This type is rare.  Lower back area (lumbar radicular pain). You would feel this pain as low back pain. You may feel pain, numbness, weakness, or tingling in the buttocks or legs. Sciatica is a type of lumbar radicular pain that shoots down the back of the leg. Radicular pain occurs when one of the spinal nerves becomes irritated or squeezed (compressed). It is often caused by something pushing on a spinal nerve, such as one of the bones of the spine (vertebrae) or one of the round cushions between vertebrae (intervertebral disks). This can result from:  An injury.  Wear and tear or aging of a disk.  The growth of a bone spur that pushes on the nerve. Radicular pain often goes away when you follow instructions from your health care provider for relieving pain at home. Follow these instructions at home: Managing pain      If directed, put ice on the affected area: ? Put ice in a plastic bag. ? Place a towel between your skin and the bag. ? Leave the ice on for 20 minutes, 2-3 times a day.  If directed, apply heat to the affected area as often as told by your health care provider. Use the heat source that your health care provider recommends, such as a moist heat pack or a heating pad. ? Place  a towel between your skin and the heat source. ? Leave the heat on for 20-30 minutes. ? Remove the heat if your skin turns bright red. This is especially important if you are unable to feel pain, heat, or cold. You may have a greater risk of getting burned. Activity   Do not sit or rest in bed for long periods of time.  Try to stay as active as possible. Ask your health care provider what type of exercise or activity is best for you.  Avoid activities that make your pain worse, such as bending and lifting.  Do not lift anything that is heavier than 10 lb (4.5 kg), or the limit that you are told, until your health care provider says that it is safe.  Practice using proper technique when lifting items. Proper lifting technique involves bending your knees and rising up.  Do strength and range-of-motion exercises only as told by your health care provider or physical therapist. General instructions  Take over-the-counter and prescription medicines only as told by your health care provider.  Pay attention to any changes in your symptoms.  Keep all follow-up visits as told by your health care provider. This is important. ? Your health care provider may send you to a physical therapist to help with this pain. Contact a health care provider if:  Your pain and other symptoms get worse.  Your pain medicine is not   helping.  Your pain has not improved after a few weeks of home care.  You have a fever. Get help right away if:  You have severe pain, weakness, or numbness.  You have difficulty with bladder or bowel control. Summary  Radicular pain is a type of pain that spreads from your back or neck along a spinal nerve.  When you have radicular pain, you may also have weakness, numbness, or tingling in the area of your body that is supplied by the nerve.  The pain may feel sharp or burning.  Radicular pain may be treated with ice, heat, medicines, or physical therapy. This  information is not intended to replace advice given to you by your health care provider. Make sure you discuss any questions you have with your health care provider. Document Revised: 11/01/2017 Document Reviewed: 11/01/2017 Elsevier Patient Education  2020 [employer]. Lumbar Strain A lumbar strain, which is sometimes called a low-back strain, is a stretch or tear in a muscle or the strong cords of tissue that attach muscle to bone (tendons) in the lower back (lumbar spine). This type of injury occurs when muscles or tendons are torn or are stretched beyond their limits. Lumbar strains can range from mild to severe. Mild strains may involve stretching a muscle or tendon without tearing it. These may heal in 1-2 weeks. More severe strains involve tearing of muscle fibers or tendons. These will cause more pain and may take 6-8 weeks to heal. What are the causes? This condition may be caused by:  Trauma, such as a fall or a hit to the body.  Twisting or overstretching the back. This may result from doing activities that need a lot of energy, such as lifting heavy objects. What increases the risk? This injury is more common in:  Athletes.  People with obesity.  People who do repeated lifting, bending, or other movements that involve their back. What are the signs or symptoms? Symptoms of this condition may include:  Sharp or dull pain in the lower back that does not go away. The pain may extend to the buttocks.  Stiffness or limited range of motion.  Sudden muscle tightening (spasms). How is this diagnosed? This condition may be diagnosed based on:  Your symptoms.  Your medical history.  A physical exam.  Imaging tests, such as: ? X-rays. ? MRI. How is this treated? Treatment for this condition may include:  Rest.  Applying heat and cold to the affected area.  Over-the-counter medicines to help relieve pain and inflammation, such as NSAIDs.  Prescription pain medicine  and muscle relaxants may be needed for a short time.  Physical therapy. Follow these instructions at home: Managing pain, stiffness, and swelling      If directed, put ice on the injured area during the first 24 hours after your injury. ? Put ice in a plastic bag. ? Place a towel between your skin and the bag. ? Leave the ice on for 20 minutes, 2-3 times a day.  If directed, apply heat to the affected area as often as told by your health care provider. Use the heat source that your health care provider recommends, such as a moist heat pack or a heating pad. ? Place a towel between your skin and the heat source. ? Leave the heat on for 20-30 minutes. ? Remove the heat if your skin turns bright red. This is especially important if you are unable to feel pain, heat, or cold. You may have  a greater risk of getting burned. Activity  Rest and return to your normal activities as told by your health care provider. Ask your health care provider what activities are safe for you.  Do exercises as told by your health care provider. Medicines  Take over-the-counter and prescription medicines only as told by your health care provider.  Ask your health care provider if the medicine prescribed to you: ? Requires you to avoid driving or using heavy machinery. ? Can cause constipation. You may need to take these actions to prevent or treat constipation:  Drink enough fluid to keep your urine pale yellow.  Take over-the-counter or prescription medicines.  Eat foods that are high in fiber, such as beans, whole grains, and fresh fruits and vegetables.  Limit foods that are high in fat and processed sugars, such as fried or sweet foods. Injury prevention To prevent a future low-back injury:  Always warm up properly before physical activity or sports.  Cool down and stretch after being active.  Use correct form when playing sports and lifting heavy objects. Bend your knees before you lift heavy  objects.  Use good posture when sitting and standing.  Stay physically fit and keep a healthy weight. ? Do at least 150 minutes of moderate-intensity exercise each week, such as brisk walking or water aerobics. ? Do strength exercises at least 2 times each week.  General instructions  Do not use any products that contain nicotine or tobacco, such as cigarettes, e-cigarettes, and chewing tobacco. If you need help quitting, ask your health care provider.  Keep all follow-up visits as told by your health care provider. This is important. Contact a health care provider if:  Your back pain does not improve after 6 weeks of treatment.  Your symptoms get worse. Get help right away if:  Your back pain is severe.  You are unable to stand or walk.  You develop pain in your legs.  You develop weakness in your buttocks or legs.  You have difficulty controlling when you urinate or when you have a bowel movement. ? You have frequent, painful, or bloody urination. ? You have a temperature over 101.12F (38.3C) Summary  A lumbar strain, which is sometimes called a low-back strain, is a stretch or tear in a muscle or the strong cords of tissue that attach muscle to bone (tendons) in the lower back (lumbar spine).  This type of injury occurs when muscles or tendons are torn or are stretched beyond their limits.  Rest and return to your normal activities as told by your health care provider. If directed, apply heat and ice to the affected area as often as told by your health care provider.  Take over-the-counter and prescription medicines only as told by your health care provider.  Contact a health care provider if you have new or worsening symptoms. This information is not intended to replace advice given to you by your health care provider. Make sure you discuss any questions you have with your health care provider. Document Revised: 02/16/2018 Document Reviewed: 02/16/2018 Elsevier  Patient Education  [place] [facility] Ask your health care provider which exercises are safe for you. Do exercises exactly as told by your health care provider and adjust them as directed. It is normal to feel mild stretching, pulling, tightness, or discomfort as you do these exercises. Stop right away if you feel sudden pain or your pain gets worse. Do not begin these exercises until  told by your health care provider. Stretching and range-of-motion exercises These exercises warm up your muscles and joints and improve the movement and flexibility of your back. These exercises also help to relieve pain, numbness, and tingling. Lumbar rotation  1. Lie on your back on a firm surface and bend your knees. 2. Straighten your arms out to your sides so each arm forms a 90-degree angle (right angle) with a side of your body. 3. Slowly move (rotate) both of your knees to one side of your body until you feel a stretch in your lower back (lumbar). Try not to let your shoulders lift off the floor. 4. Hold this position for ___15-30______ seconds. 5. Tense your abdominal muscles and slowly move your knees back to the starting position. 6. Repeat this exercise on the other side of your body. Repeat _____3_____ times. Complete this exercise _______3___ times a day. Single knee to chest  1. Lie on your back on a firm surface with both legs straight. 2. Bend one of your knees. Use your hands to move your knee up toward your chest until you feel a gentle stretch in your lower back and buttock. ? Hold your leg in this position by holding on to the front of your knee. ? Keep your other leg as straight as possible. 3. Hold this position for ___15-30_______ seconds. 4. Slowly return to the starting position. 5. Repeat with your other leg. Repeat _____3_____ times. Complete this exercise _____3_____ times a day. Prone extension on elbows  1. Lie on your abdomen on a firm surface  (prone position). 2. Prop yourself up on your elbows. 3. Use your arms to help lift your chest up until you feel a gentle stretch in your abdomen and your lower back. ? This will place some of your body weight on your elbows. If this is uncomfortable, try stacking pillows under your chest. ? Your hips should stay down, against the surface that you are lying on. Keep your hip and back muscles relaxed. 4. Hold this position for ___15-30_______ seconds. 5. Slowly relax your upper body and return to the starting position. Repeat ___3_______ times. Complete this exercise _____3_____ times a day. Strengthening exercises These exercises build strength and endurance in your back. Endurance is the ability to use your muscles for a long time, even after they get tired. Pelvic tilt This exercise strengthens the muscles that lie deep in the abdomen. 1. Lie on your back on a firm surface. Bend your knees and keep your feet flat on the floor. 2. Tense your abdominal muscles. Tip your pelvis up toward the ceiling and flatten your lower back into the floor. ? To help with this exercise, you may place a small towel under your lower back and try to push your back into the towel. 3. Hold this position for __15-30________ seconds. 4. Let your muscles relax completely before you repeat this exercise. Repeat _____3_____ times. Complete this exercise ____3______ times a day. Alternating arm and leg raises  1. Get on your hands and knees on a firm surface. If you are on a hard floor, you may want to use padding, such as an exercise mat, to cushion your knees. 2. Line up your arms and legs. Your hands should be directly below your shoulders, and your knees should be directly below your hips. 3. Lift your left leg behind you. At the same time, raise your right arm and straighten it in front of you. ? Do not lift your leg higher  than your hip. ? Do not lift your arm higher than your shoulder. ? Keep your abdominal and  back muscles tight. ? Keep your hips facing the ground. ? Do not arch your back. ? Keep your balance carefully, and do not hold your breath. 4. Hold this position for ___15-30_______ seconds. 5. Slowly return to the starting position. 6. Repeat with your right leg and your left arm. Repeat ____3______ times. Complete this exercise ____3______ times a day. Abdominal set with straight leg raise  1. Lie on your back on a firm surface. 2. Bend one of your knees and keep your other leg straight. 3. Tense your abdominal muscles and lift your straight leg up, 4-6 inches (10-15 cm) off the ground. 4. Keep your abdominal muscles tight and hold this position for __15-30________ seconds. ? Do not hold your breath. ? Do not arch your back. Keep it flat against the ground. 5. Keep your abdominal muscles tense as you slowly lower your leg back to the starting position. 6. Repeat with your other leg. Repeat ______3____ times. Complete this exercise ___3_______ times a day. Single leg lower with bent knees 1. Lie on your back on a firm surface. 2. Tense your abdominal muscles and lift your feet off the floor, one foot at a time, so your knees and hips are bent in 90-degree angles (right angles). ? Your knees should be over your hips and your lower legs should be parallel to the floor. 3. Keeping your abdominal muscles tense and your knee bent, slowly lower one of your legs so your toe touches the ground. 4. Lift your leg back up to return to the starting position. ? Do not hold your breath. ? Do not let your back arch. Keep your back flat against the ground. 5. Repeat with your other leg. Repeat ____3______ times. Complete this exercise _____3 Muscle Cramps and Spasms Muscle cramps and spasms occur when a muscle or muscles tighten and you have no control over this tightening (involuntary muscle contraction). They are a common problem and can develop in any muscle. The most common place is in the calf  muscles of the leg. Muscle cramps and muscle spasms are both involuntary muscle contractions, but there are some differences between the two:  Muscle cramps are painful. They come and go and may last for a few seconds or up to 15 minutes. Muscle cramps are often more forceful and last longer than muscle spasms.  Muscle spasms may or may not be painful. They may also last just a few seconds or much longer. Certain medical conditions, such as diabetes or Parkinson's disease, can make it more likely to develop cramps or spasms. However, cramps or spasms are usually not caused by a serious underlying problem. Common causes include:  Doing more physical work or exercise than your body is ready for (overexertion).  Overuse from repeating certain movements too many times.  Remaining in a certain position for a long period of time.  Improper preparation, form, or technique while playing a sport or doing an activity.  Dehydration.  Injury.  Side effects of some medicines.  Abnormally low levels of the salts and minerals in your blood (electrolytes), especially potassium and calcium. This could happen if you are taking water pills (diuretics) or if you are pregnant. In many cases, the cause of muscle cramps or spasms is not known. Follow these instructions at home: Managing pain and stiffness      Try massaging, stretching, and relaxing the affected  muscle. Do this for several minutes at a time.  If directed, apply heat to tight or tense muscles as often as told by your health care provider. Use the heat source that your health care provider recommends, such as a moist heat pack or a heating pad. ? Place a towel between your skin and the heat source. ? Leave the heat on for 20-30 minutes. ? Remove the heat if your skin turns bright red. This is especially important if you are unable to feel pain, heat, or cold. You may have a greater risk of getting burned.  If directed, put ice on the  affected area. This may help if you are sore or have pain after a cramp or spasm. ? Put ice in a plastic bag. ? Place a towel between your skin and the bag. ? Leavethe ice on for 20 minutes, 2-3 times a day.  Try taking hot showers or baths to help relax tight muscles. Eating and drinking  Drink enough fluid to keep your urine pale yellow. Staying well hydrated may help prevent cramps or spasms.  Eat a healthy diet that includes plenty of nutrients to help your muscles function. A healthy diet includes fruits and vegetables, lean protein, whole grains, and low-fat or nonfat dairy products. General instructions  If you are having frequent cramps, avoid intense exercise for several days.  Take over-the-counter and prescription medicines only as told by your health care provider.  Pay attention to any changes in your symptoms.  Keep all follow-up visits as told by your health care provider. This is important. Contact a health care provider if:  Your cramps or spasms get more severe or happen more often.  Your cramps or spasms do not improve over time. Summary  Muscle cramps and spasms occur when a muscle or muscles tighten and you have no control over this tightening (involuntary muscle contraction).  The most common place for cramps or spasms to occur is in the calf muscles of the leg.  Massaging, stretching, and relaxing the affected muscle may relieve the cramp or spasm.  Drink enough fluid to keep your urine pale yellow. Staying well hydrated may help prevent cramps or spasms. This information is not intended to replace advice given to you by your health care provider. Make sure you discuss any questions you have with your health care provider. Document Revised: 09/12/2017 Document Reviewed: 09/12/2017 Elsevier Patient Education  2020 [employer]. _____ times a day. Posture and body mechanics Good posture and healthy body mechanics can help to relieve stress in your body's  tissues and joints. Body mechanics refers to the movements and positions of your body while you do your daily activities. Posture is part of body mechanics. Good posture means:  Your spine is in its natural S-curve position (neutral).  Your shoulders are pulled back slightly.  Your head is not tipped forward. Follow these guidelines to improve your posture and body mechanics in your everyday activities. Standing   When standing, keep your spine neutral and your feet about hip width apart. Keep a slight bend in your knees. Your ears, shoulders, and hips should line up.  When you do a task in which you stand in one place for a long time, place one foot up on a stable object that is 2-4 inches (5-10 cm) high, such as a footstool. This helps keep your spine neutral. Sitting   When sitting, keep your spine neutral and keep your feet flat on the floor. Use a  footrest, if necessary, and keep your thighs parallel to the floor. Avoid rounding your shoulders, and avoid tilting your head forward.  When working at a desk or a computer, keep your desk at a height where your hands are slightly lower than your elbows. Slide your chair under your desk so you are close enough to maintain good posture.  When working at a computer, place your monitor at a height where you are looking straight ahead and you do not have to tilt your head forward or downward to look at the screen. Resting  When lying down and resting, avoid positions that are most painful for you.  If you have pain with activities such as sitting, bending, stooping, or squatting, lie in a position in which your body does not bend very much. For example, avoid curling up on your side with your arms and knees near your chest (fetal position).  If you have pain with activities such as standing for a long time or reaching with your arms, lie with your spine in a neutral position and bend your knees slightly. Try the following positions: ? Lying  on your side with a pillow between your knees. ? Lying on your back with a pillow under your knees. Lifting   When lifting objects, keep your feet at least shoulder width apart and tighten your abdominal muscles.  Bend your knees and hips and keep your spine neutral. It is important to lift using the strength of your legs, not your back. Do not lock your knees straight out.  Always ask for help to lift heavy or awkward objects. This information is not intended to replace advice given to you by your health care provider. Make sure you discuss any questions you have with your health care provider. Document Revised: 08/11/2018 Document Reviewed: 05/11/2018 Elsevier Patient Education  2020 [employer] Sacroiliac Joint Dysfunction  Sacroiliac joint dysfunction is a condition that causes inflammation on one or both sides of the sacroiliac (SI) joint. The SI joint connects the lower part of the spine (sacrum) with the two upper portions of the pelvis (ilium). This condition causes deep aching or burning pain in the low back. In some cases, the pain may also spread into one or both buttocks, hips, or thighs. What are the causes? This condition may be caused by:  Pregnancy. During pregnancy, extra stress is put on the SI joints because the pelvis widens.  Injury, such as: ? Injuries from car accidents. ? Sports-related injuries. ? Work-related injuries.  Having one leg that is shorter than the other.  Conditions that affect the joints, such as: ? Rheumatoid arthritis. ? Gout. ? Psoriatic arthritis. ? Joint infection (septic arthritis). Sometimes, the cause of SI joint dysfunction is not known. What are the signs or symptoms? Symptoms of this condition include:  Aching or burning pain in the lower back. The pain may also spread to other areas, such as: ? Buttocks. ? Groin. ? Thighs.  Muscle spasms in or around the painful areas.  Increased pain when standing, walking, running,  stair climbing, bending, or lifting. How is this diagnosed? This condition is diagnosed with a physical exam and medical history. During the exam, the health care provider may move one or both of your legs to different positions to check for pain. Various tests may be done to confirm the diagnosis, including:  Imaging tests to look for other causes of pain. These may include: ? MRI. ? CT scan. ? Bone scan.  Diagnostic injection.  A numbing medicine is injected into the SI joint using a needle. If your pain is temporarily improved or stopped after the injection, this can indicate that SI joint dysfunction is the problem. How is this treated? Treatment depends on the cause and severity of your condition. Treatment options may include:  Ice or heat applied to the lower back area after an injury. This may help reduce pain and muscle spasms.  Medicines to relieve pain or inflammation or to relax the muscles.  Wearing a back brace (sacroiliac brace) to help support the joint while your back is healing.  Physical therapy to increase muscle strength around the joint and flexibility at the joint. This may also involve learning proper body positions and ways of moving to relieve stress on the joint.  Direct manipulation of the SI joint.  Injections of steroid medicine into the joint to reduce pain and swelling.  Radiofrequency ablation to burn away nerves that are carrying pain messages from the joint.  Use of a device that provides electrical stimulation to help reduce pain at the joint.  Surgery to put in screws and plates that limit or prevent joint motion. This is rare. Follow these instructions at home: Medicines  Take over-the-counter and prescription medicines only as told by your health care provider.  Do not drive or use heavy machinery while taking prescription pain medicine.  If you are taking prescription pain medicine, take actions to prevent or treat constipation. Your health  care provider may recommend that you: ? Drink enough fluid to keep your urine pale yellow. ? Eat foods that are high in fiber, such as fresh fruits and vegetables, whole grains, and beans. ? Limit foods that are high in fat and processed sugars, such as fried or sweet foods. ? Take an over-the-counter or prescription medicine for constipation. If you have a brace:  Wear the brace as told by your health care provider. Remove it only as told by your health care provider.  Keep the brace clean.  If the brace is not waterproof: ? Do not let it get wet. ? Cover it with a watertight covering when you take a bath or a shower. Managing pain, stiffness, and swelling      Icing can help with pain and swelling. Heat may help with muscle tension or spasms. Ask your health care provider if you should use ice or heat.  If directed, put ice on the affected area: ? If you have a removable brace, remove it as told by your health care provider. ? Put ice in a plastic bag. ? Place a towel between your skin and the bag. ? Leave the ice on for 20 minutes, 2-3 times a day.  If directed, apply heat to the affected area. Use the heat source that your health care provider recommends, such as a moist heat pack or a heating pad. ? Place a towel between your skin and the heat source. ? Leave the heat on for 20-30 minutes. ? Remove the heat if your skin turns bright red. This is especially important if you are unable to feel pain, heat, or cold. You may have a greater risk of getting burned. General instructions  Rest as needed. Ask your health care provider what activities are safe for you.  Return to your normal activities as told by your health care provider.  Exercise as directed by your health care provider or physical therapist.  Do not use any products that contain nicotine or tobacco, such as  cigarettes and e-cigarettes. These can delay bone healing. If you need help quitting, ask your health care  provider.  Keep all follow-up visits as told by your health care provider. This is important. Contact a health care provider if:  Your pain is not controlled with medicine.  You have a fever.  Your pain is getting worse. Get help right away if:  You have weakness, numbness, or tingling in your legs or feet.  You lose control of your bladder or bowel. Summary  Sacroiliac joint dysfunction is a condition that causes inflammation on one or both sides of the sacroiliac (SI) joint.  This condition causes deep aching or burning pain in the low back. In some cases, the pain may also spread into one or both buttocks, hips, or thighs.  Treatment depends on the cause and severity of your condition. It may include medicines to reduce pain and swelling or to relax muscles. This information is not intended to replace advice given to you by your health care provider. Make sure you discuss any questions you have with your health care provider. Document Revised: 12/14/2017 Document Reviewed: 05/30/2017 Elsevier Patient Education  2020 [employer]  Degenerative Disk Disease  Degenerative disk disease is a condition caused by changes that occur in the spinal disks as a person ages. Spinal disks are soft and compressible disks located between the bones of your spine (vertebrae). These disks act like shock absorbers. Degenerative disk disease can affect the whole spine. However, the neck and lower back are most often affected. Many changes can occur in the spinal disks with aging, such as:  The spinal disks may dry and shrink.  Small tears may occur in the tough, outer covering of the disk (annulus).  The disk space may become smaller due to loss of water.  Abnormal growths in the bone (spurs) may occur. This can put pressure on the nerve roots exiting the spinal canal, causing pain.  The spinal canal may become narrowed. What are the causes? This condition may be caused by:  Normal  degeneration with age.  Injuries.  Certain activities and sports that cause damage. What increases the risk? The following factors may make you more likely to develop this condition:  Being overweight.  Having a family history of degenerative disk disease.  Smoking.  Sudden injury.  Doing work that requires heavy lifting. What are the signs or symptoms? Symptoms of this condition include:  Pain that varies in intensity. Some people have no pain, while others have severe pain. The location of the pain depends on the part of your backbone that is affected. You may have: ? Pain in your neck or arm if a disk in your neck area is affected. ? Pain in your back, buttocks, or legs if a disk in your lower back is affected.  Pain that becomes worse while bending or reaching up, or with twisting movements.  Pain that may start gradually and then get worse as time passes. It may also start after a major or minor injury.  Numbness or tingling in the arms or legs. How is this diagnosed? This condition may be diagnosed based on:  Your symptoms and medical history.  A physical exam.  Imaging tests, including: ? An X-ray of the spine. ? MRI. How is this treated? This condition may be treated with:  Medicines.  Rehabilitation exercises. These activities aim to strengthen muscles in your back and abdomen to better support your spine. If treatments do not help to  relieve your symptoms or you have severe pain, you may need surgery. Follow these instructions at home: Medicines  Take over-the-counter and prescription medicines only as told by your health care provider.  Do not drive or use heavy machinery while taking prescription pain medicine.  If you are taking prescription pain medicine, take actions to prevent or treat constipation. Your health care provider may recommend that you: ? Drink enough fluid to keep your urine pale yellow. ? Eat foods that are high in fiber, such as  fresh fruits and vegetables, whole grains, and beans. ? Limit foods that are high in fat and processed sugars, such as fried or sweet foods. ? Take an over-the-counter or prescription medicine for constipation. Activity  Rest as told by your health care provider.  Ask your health care provider what activities are safe for you. Return to your normal activities as directed.  Avoid sitting for a long time without moving. Get up to take short walks every 1-2 hours. This is important to improve blood flow and breathing. Ask for help if you feel weak or unsteady.  Perform relaxation exercises as told by your health care provider.  Maintain good posture.  Do not lift anything that is heavier than 10 lb (4.5 kg), or the limit that you are told, until your health care provider says that it is safe.  Follow proper lifting and walking techniques as told by your health care provider. Managing pain, stiffness, and swelling   If directed, put ice on the painful area. Icing can help to relieve pain. ? Put ice in a plastic bag. ? Place a towel between your skin and the bag. ? Leave the ice on for 20 minutes, 2-3 times a day.  If directed, apply heat to the painful area as often as told by your health care provider. Heat can reduce the stiffness of your muscles. Use the heat source that your health care provider recommends, such as a moist heat pack or a heating pad. ? Place a towel between your skin and the heat source. ? Leave the heat on for 20-30 minutes. ? Remove the heat if your skin turns bright red. This is especially important if you are unable to feel pain, heat, or cold. You may have a greater risk of getting burned. General instructions  Change your sitting, standing, and sleeping habits as told by your health care provider.  Avoid sitting in the same position for long periods of time. Change positions frequently.  Lose weight or maintain a healthy weight as told by your health care  provider.  Do not use any products that contain nicotine or tobacco, such as cigarettes and e-cigarettes. If you need help quitting, ask your health care provider.  Wear supportive footwear.  Keep all follow-up visits as told by your health care provider. This is important. This may include visits for physical therapy. Contact a health care provider if you:  Have pain that does not go away within 1-4 weeks.  Lose your appetite.  Lose weight without trying. Get help right away if you:  Have severe pain.  Notice weakness in your arms, hands, or legs.  Begin to lose control of your bladder or bowel movements.  Have fevers or night sweats. Summary  Degenerative disk disease is a condition caused by changes that occur in the spinal disks as a person ages.  Degenerative disk disease can affect the whole spine. However, the neck and lower back are most often affected.  Take  over-the-counter and prescription medicines only as told by your health care provider. This information is not intended to replace advice given to you by your health care provider. Make sure you discuss any questions you have with your health care provider. Document Revised: 04/14/2017 Document Reviewed: 04/14/2017 Elsevier Patient Education  2020 [employer]

## 2020-07-24 ENCOUNTER — Telehealth: Payer: Self-pay | Admitting: *Deleted

## 2020-07-24 NOTE — Telephone Encounter (Signed)
Pt in to clinic c/o burning sensation to R upper abd that feels similar to when she had shingles on her L abd. But no rash present. Denies n/v/d. Asked if it could be shingles again, however pt reports sensation has now been present for >1 month. Advised pt shingles rash typically develops within 1 week from initial sx. Recommended f/u with pcp. Pt agreeable. Denies further questions/concerns.

## 2020-08-26 ENCOUNTER — Other Ambulatory Visit: Payer: Self-pay

## 2020-08-26 ENCOUNTER — Ambulatory Visit: Payer: Self-pay | Admitting: Registered Nurse

## 2020-08-26 ENCOUNTER — Encounter: Payer: Self-pay | Admitting: Registered Nurse

## 2020-08-26 VITALS — BP 144/93 | HR 93 | Temp 98.1°F

## 2020-08-26 DIAGNOSIS — Z8639 Personal history of other endocrine, nutritional and metabolic disease: Secondary | ICD-10-CM

## 2020-08-26 DIAGNOSIS — R030 Elevated blood-pressure reading, without diagnosis of hypertension: Secondary | ICD-10-CM

## 2020-08-26 DIAGNOSIS — M5136 Other intervertebral disc degeneration, lumbar region: Secondary | ICD-10-CM

## 2020-08-26 DIAGNOSIS — M461 Sacroiliitis, not elsewhere classified: Secondary | ICD-10-CM

## 2020-08-26 DIAGNOSIS — E0800 Diabetes mellitus due to underlying condition with hyperosmolarity without nonketotic hyperglycemic-hyperosmolar coma (NKHHC): Secondary | ICD-10-CM

## 2020-08-26 MED ORDER — PREDNISONE 10 MG PO TABS: 10.0000 mg | ORAL_TABLET | Freq: Every day | ORAL | 0 refills | Status: AC

## 2020-08-26 NOTE — Progress Notes (Signed)
Subjective:    Patient ID: Sara Weaver, female    DOB: [birth year], 65 y.o.   MRN: [id number]  64y/o Caucasian established female pt c/o sciatic pain to R lower back and extending down into buttock and R leg. Describes as sharp, shooting pain. Is intermittent and more controlled than it was prior to Dec 2021 appt. She reports that Prednisone Rx'd at that time helped a lot and she feels kept sciatica from flaring up but it is becoming more increasingly frequent occurring once or twice a day. Patient has only been seeing pain management PCM retired.  Hasn't been checking her blood sugars.  Last labs Mar 2021 per chart review.  Taking hydrocodone once a day.  Flexeril makes her feel funny rarely uses.  Denied recent changes in workload at work or activities at home (denied gardening/spring cleaning).  Last prednisone rx from me Dec 2021.      Review of Systems  Constitutional: Negative for activity change, appetite change, chills, diaphoresis, fatigue and fever.  HENT: Negative for trouble swallowing and voice change.   Eyes: Negative for photophobia and visual disturbance.  Respiratory: Negative for cough, shortness of breath, wheezing and stridor.   Cardiovascular: Negative for chest pain and leg swelling.  Gastrointestinal: Negative for abdominal pain, constipation, diarrhea, nausea and vomiting.  Endocrine: Negative for cold intolerance and heat intolerance.  Genitourinary: Negative for difficulty urinating and enuresis.  Musculoskeletal: Positive for arthralgias, back pain and myalgias. Negative for gait problem, joint swelling, neck pain and neck stiffness.  Skin: Negative for color change, pallor, rash and wound.  Allergic/Immunologic: Negative for food allergies.  Neurological: Negative for dizziness, tremors, seizures, syncope, facial asymmetry, speech difficulty, weakness, light-headedness and headaches.  Hematological: Negative for adenopathy. Does not bruise/bleed easily.   Psychiatric/Behavioral: Negative for agitation, confusion and sleep disturbance.       Objective:   Physical Exam Vitals and nursing note reviewed.  Constitutional:      General: She is awake. She is not in acute distress.    Appearance: Normal appearance. She is well-developed and well-groomed. She is obese. She is not ill-appearing, toxic-appearing or diaphoretic.  HENT:     Head: Normocephalic and atraumatic.     Jaw: There is normal jaw occlusion.     Right Ear: Hearing and external ear normal.     Left Ear: Hearing and external ear normal.     Nose: Nose normal. No congestion or rhinorrhea.     Mouth/Throat:     Lips: Pink. No lesions.     Mouth: Mucous membranes are moist. No injury, lacerations, oral lesions or angioedema.     Pharynx: Oropharynx is clear. Uvula midline. No oropharyngeal exudate.  Eyes:     General: Lids are normal. Vision grossly intact. Gaze aligned appropriately. Allergic shiner present. No visual field deficit or scleral icterus.       Right eye: No discharge.        Left eye: No discharge.     Conjunctiva/sclera: Conjunctivae normal.     Pupils: Pupils are equal, round, and reactive to light.  Neck:     Thyroid: No thyromegaly.     Vascular: No JVD.     Trachea: Trachea and phonation normal. No tracheal deviation.  Cardiovascular:     Rate and Rhythm: Normal rate and regular rhythm.     Pulses: Normal pulses.          Radial pulses are 2+ on the right side and 2+ on the left  side.  Pulmonary:     Effort: Pulmonary effort is normal. No respiratory distress.     Breath sounds: Normal breath sounds and air entry. No stridor or transmitted upper airway sounds. No wheezing or rales.     Comments: Spoke full sentences without difficulty; no cough/nasal sniffing or throat clearing observed in exam room Abdominal:     Palpations: Abdomen is soft.     Tenderness: There is no abdominal tenderness.  Musculoskeletal:        General: No swelling,  tenderness, deformity or signs of injury.     Right shoulder: Normal.     Left shoulder: Normal.     Right elbow: Normal.     Left elbow: Normal.     Right hand: Normal.     Left hand: Normal.     Cervical back: Normal range of motion and neck supple. No swelling, edema, deformity, erythema, signs of trauma, lacerations, rigidity, spasms, torticollis, tenderness or crepitus. No pain with movement. Normal range of motion.     Thoracic back: No swelling, edema, deformity, signs of trauma, lacerations, spasms, tenderness or bony tenderness. Decreased range of motion. No scoliosis.     Lumbar back: No swelling, edema, deformity, signs of trauma, lacerations, spasms, tenderness or bony tenderness. Decreased range of motion. No scoliosis.       Back:     Right hip: No deformity, lacerations, tenderness, bony tenderness or crepitus. Decreased range of motion. Normal strength.     Left hip: No deformity, lacerations, tenderness, bony tenderness or crepitus. Decreased range of motion. Normal strength.     Right upper leg: Normal.     Left upper leg: No swelling, edema, deformity, lacerations or bony tenderness.     Right knee: Normal.     Left knee: Normal.     Right lower leg: Normal. No edema.     Left lower leg: Normal. No edema.     Right ankle: Normal.     Left ankle: Normal.       Legs:     Comments: Able to touch shins with slow stretching after touching knees; right rotation patient making facial expressions but denies worsening pain/symptoms; on/off sitting exam table without difficulty; bilateral AROM hips equal, holds onto cabinet for balance; normal heel toe gait in exam room; tight hamstrings bilaterally; no crepitus with AROM; bilateral SI joints not TTP; bilateral hip bursa not TTP  Lymphadenopathy:     Head:     Right side of head: No preauricular adenopathy.     Left side of head: No preauricular adenopathy.     Cervical: No cervical adenopathy.     Right cervical: No  superficial cervical adenopathy.    Left cervical: No superficial cervical adenopathy.  Skin:    General: Skin is warm and dry.     Capillary Refill: Capillary refill takes less than 2 seconds.     Coloration: Skin is not ashen, cyanotic, jaundiced, mottled, pale or sallow.     Findings: No abrasion, abscess, acne, bruising, burn, ecchymosis, erythema, signs of injury, laceration, lesion, petechiae, rash or wound. Rash is not crusting, macular, nodular, papular, purpuric, pustular, scaling, urticarial or vesicular.     Nails: There is no clubbing.  Neurological:     General: No focal deficit present.     Mental Status: She is alert and oriented to person, place, and time. Mental status is at baseline. She is not disoriented.     GCS: GCS eye subscore is 4. GCS  verbal subscore is 5. GCS motor subscore is 6.     Cranial Nerves: Cranial nerves are intact. No cranial nerve deficit, dysarthria or facial asymmetry.     Sensory: Sensation is intact. No sensory deficit.     Motor: Motor function is intact. No weakness, tremor, atrophy, abnormal muscle tone or seizure activity.     Coordination: Coordination is intact. Coordination normal.     Gait: Gait is intact. Gait normal.     Deep Tendon Reflexes: Reflexes normal.     Comments: Strength 5/5 bilaterally arms/legs; normal heel toe gait; in/out of chair without difficulty and on/off exam table  Psychiatric:        Attention and Perception: Attention and perception normal. She is attentive.        Mood and Affect: Mood and affect normal.        Speech: Speech normal.        Behavior: Behavior normal. Behavior is cooperative.        Thought Content: Thought content normal.        Cognition and Memory: Cognition and memory normal.        Judgment: Judgment normal.           Assessment & Plan:  A-sacroiliac joint inflammation and arthritis, type II diabetes without long term insulin use, history vitamin D deficiency  P-Hx xrays showed  arthritis and degenerative disc disease 2019. 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  Patient reported she is able to perform at this time but would follow up if situation changes.  Heat/gentle arom/tylenol 1000mg  po q6h prn pain most helpful.  Consider epsom salt soak.  Biofreeze gel topical QID prn pain.  Separate administration valium and norco and flexeril by 2 hours as each can cause drowsiness.  Patient still has at home from previous dispense cyclobenazeprine/flexeril 10mg  sig take 1/2 tab po TID prn muscle spasms #30 RF0 dispensed from [facility].   Avoid alcohol intake and driving while taking cyclobenazeprine/flexeril as drowsiness common side effect.  Slow position changes as medication also lower blood pressure.  Home stretches demonstrated to patient-e.g. t/l spine AROM flexion/extension/lateral bending/rotation, knee to chest and rock side to side on back. Self massage or professional prn, foam roller use or tennis/racquetball.  Heat/cryotherapy 15 minutes QID prn.  Follow up with pain management provider and notify provider she was see at [facility] and given biofreeze/flexeril/prednisone pulse and stretches.  Prednisone 10mg  sig take 2 po with breakfast x 4 days #21 RF0 discussed with patient drink water, avoid dehydration, healthy food choices/low added sugar as steroids will increase blood sugar levels.  Patient scheduled for fasting annual labs as last Mar 2021 with RN female exec panel, vitamin d and Hgba1c. Consider physical therapy referral if no improvement with prescribed therapy from [facility] and/or chiropractic care.  Ensure ergonomics correct desk at work avoid repetitive motions if possible/holding phone/laptop in hand use desk/stand and/or break up lifting items into smaller loads/weights.  Patient was instructed to rest, ice, and ROM exercises.  Activity as  tolerated.   Follow up if symptoms persist or worsen especially if loss of bowel/bladder control, arm/leg weakness and/or saddle paresthesias.  Exitcare handouts printed and given on sciatica, arthritis, sciatica exercises rehab.  Patient verbalized agreement and understanding of treatment plan and had no further questions at this time.  P2:  Injury Prevention  and Fitness.

## 2020-08-26 NOTE — Patient Instructions (Signed)
Epsom salt soak daily Gentle stretching twice a day Drink water to keep urine pale yellow clear and voiding every 4 hours while awake   Sciatica  Sciatica is pain, numbness, weakness, or tingling along the path of the sciatic nerve. The sciatic nerve starts in the lower back and runs down the back of each leg. The nerve controls the muscles in the lower leg and in the back of the knee. It also provides feeling (sensation) to the back of the thigh, the lower leg, and the sole of the foot. Sciatica is a symptom of another medical condition that pinches or puts pressure on the sciatic nerve. Sciatica most often only affects one side of the body. Sciatica usually goes away on its own or with treatment. In some cases, sciatica may come back (recur). What are the causes? This condition is caused by pressure on the sciatic nerve or pinching of the nerve. This may be the result of:  A disk in between the bones of the spine bulging out too far (herniated disk).  Age-related changes in the spinal disks.  A pain disorder that affects a muscle in the buttock.  Extra bone growth near the sciatic nerve.  A break (fracture) of the pelvis.  Pregnancy.  Tumor. This is rare. What increases the risk? The following factors may make you more likely to develop this condition:  Playing sports that place pressure or stress on the spine.  Having poor strength and flexibility.  A history of back injury or surgery.  Sitting for long periods of time.  Doing activities that involve repetitive bending or lifting.  Obesity. What are the signs or symptoms? Symptoms can vary from mild to very severe, and they may include:  Any of these problems in the lower back, leg, hip, or buttock: ? Mild tingling, numbness, or dull aches. ? Burning sensations. ? Sharp pains.  Numbness in the back of the calf or the sole of the foot.  Leg weakness.  Severe back pain that makes movement difficult. Symptoms may  get worse when you cough, sneeze, or laugh, or when you sit or stand for long periods of time. How is this diagnosed? This condition may be diagnosed based on:  Your symptoms and medical history.  A physical exam.  Blood tests.  Imaging tests, such as: ? X-rays. ? MRI. ? CT scan. How is this treated? In many cases, this condition improves on its own without treatment. However, treatment may include:  Reducing or modifying physical activity.  Exercising and stretching.  Icing and applying heat to the affected area.  Medicines that help to: ? Relieve pain and swelling. ? Relax your muscles.  Injections of medicines that help to relieve pain, irritation, and inflammation around the sciatic nerve (steroids).  Surgery. Follow these instructions at home: Medicines  Take over-the-counter and prescription medicines only as told by your health care provider.  Ask your health care provider if the medicine prescribed to you: ? Requires you to avoid driving or using heavy machinery. ? Can cause constipation. You may need to take these actions to prevent or treat constipation:  Drink enough fluid to keep your urine pale yellow.  Take over-the-counter or prescription medicines.  Eat foods that are high in fiber, such as beans, whole grains, and fresh fruits and vegetables.  Limit foods that are high in fat and processed sugars, such as fried or sweet foods. Managing pain  If directed, put ice on the affected area. ? Put ice  in a plastic bag. ? Place a towel between your skin and the bag. ? Leave the ice on for 20 minutes, 2-3 times a day.  If directed, apply heat to the affected area. Use the heat source that your health care provider recommends, such as a moist heat pack or a heating pad. ? Place a towel between your skin and the heat source. ? Leave the heat on for 20-30 minutes. ? Remove the heat if your skin turns bright red. This is especially important if you are  unable to feel pain, heat, or cold. You may have a greater risk of getting burned.      Activity  Return to your normal activities as told by your health care provider. Ask your health care provider what activities are safe for you.  Avoid activities that make your symptoms worse.  Take brief periods of rest throughout the day. ? When you rest for longer periods, mix in some mild activity or stretching between periods of rest. This will help to prevent stiffness and pain. ? Avoid sitting for long periods of time without moving. Get up and move around at least one time each hour.  Exercise and stretch regularly, as told by your health care provider.  Do not lift anything that is heavier than 10 lb (4.5 kg) while you have symptoms of sciatica. When you do not have symptoms, you should still avoid heavy lifting, especially repetitive heavy lifting.  When you lift objects, always use proper lifting technique, which includes: ? Bending your knees. ? Keeping the load close to your body. ? Avoiding twisting.   General instructions  Maintain a healthy weight. Excess weight puts extra stress on your back.  Wear supportive, comfortable shoes. Avoid wearing high heels.  Avoid sleeping on a mattress that is too soft or too hard. A mattress that is firm enough to support your back when you sleep may help to reduce your pain.  Keep all follow-up visits as told by your health care provider. This is important. Contact a health care provider if:  You have pain that: ? Wakes you up when you are sleeping. ? Gets worse when you lie down. ? Is worse than you have experienced in the past. ? Lasts longer than 4 weeks.  You have an unexplained weight loss. Get help right away if:  You are not able to control when you urinate or have bowel movements (incontinence).  You have: ? Weakness in your lower back, pelvis, buttocks, or legs that gets worse. ? Redness or swelling of your back. ? A burning  sensation when you urinate. Summary  Sciatica is pain, numbness, weakness, or tingling along the path of the sciatic nerve.  This condition is caused by pressure on the sciatic nerve or pinching of the nerve.  Sciatica can cause pain, numbness, or tingling in the lower back, legs, hips, and buttocks.  Treatment often includes rest, exercise, medicines, and applying ice or heat. This information is not intended to replace advice given to you by your health care provider. Make sure you discuss any questions you have with your health care provider. Document Revised: 05/08/2018 Document Reviewed: 05/08/2018 [employer] Patient Education  2021 [employer]  Sciatica Rehab Ask your health care provider which exercises are safe for you. Do exercises exactly as told by your health care provider and adjust them as directed. It is normal to feel mild stretching, pulling, tightness, or discomfort as you do these exercises. Stop right away if  you feel sudden pain or your pain gets worse. Do not begin these exercises until told by your health care provider. Stretching and range-of-motion exercises These exercises warm up your muscles and joints and improve the movement and flexibility of your hips and back. These exercises also help to relieve pain, numbness, and tingling. Sciatic nerve glide 1. Sit in a chair with your head facing down toward your chest. Place your hands behind your back. Let your shoulders slump forward. 2. Slowly straighten one of your legs while you tilt your head back as if you are looking toward the ceiling. Only straighten your leg as far as you can without making your symptoms worse. 3. Hold this position for ____15______ seconds. 4. Slowly return to the starting position. 5. Repeat with your other leg. Repeat ___3______ times. Complete this exercise ____3______ times a day. Knee to chest with hip adduction and internal rotation 1. Lie on your back on a firm surface with both  legs straight. 2. Bend one of your knees and move it up toward your chest until you feel a gentle stretch in your lower back and buttock. Then, move your knee toward the shoulder that is on the opposite side from your leg. This is hip adduction and internal rotation. ? Hold your leg in this position by holding on to the front of your knee. 3. Hold this position for ____15______ seconds. 4. Slowly return to the starting position. 5. Repeat with your other leg. Repeat ____3_____ times. Complete this exercise ___3_______ times a day.   Prone extension on elbows 1. Lie on your abdomen on a firm surface. A bed may be too soft for this exercise. 2. Prop yourself up on your elbows. 3. Use your arms to help lift your chest up until you feel a gentle stretch in your abdomen and your lower back. ? This will place some of your body weight on your elbows. If this is uncomfortable, try stacking pillows under your chest. ? Your hips should stay down, against the surface that you are lying on. Keep your hip and back muscles relaxed. 4. Hold this position for ____15______ seconds. 5. Slowly relax your upper body and return to the starting position. Repeat _____3_____ times. Complete this exercise __3________ times a day.   Strengthening exercises These exercises build strength and endurance in your back. Endurance is the ability to use your muscles for a long time, even after they get tired. Pelvic tilt This exercise strengthens the muscles that lie deep in the abdomen. 1. Lie on your back on a firm surface. Bend your knees and keep your feet flat on the floor. 2. Tense your abdominal muscles. Tip your pelvis up toward the ceiling and flatten your lower back into the floor. ? To help with this exercise, you may place a small towel under your lower back and try to push your back into the towel. 3. Hold this position for ____15______ seconds. 4. Let your muscles relax completely before you repeat this  exercise. Repeat ____3______ times. Complete this exercise ____3______ times a day. Alternating arm and leg raises 1. Get on your hands and knees on a firm surface. If you are on a hard floor, you may want to use padding, such as an exercise mat, to cushion your knees. 2. Line up your arms and legs. Your hands should be directly below your shoulders, and your knees should be directly below your hips. 3. Lift your left leg behind you. At the same time, raise  your right arm and straighten it in front of you. ? Do not lift your leg higher than your hip. ? Do not lift your arm higher than your shoulder. ? Keep your abdominal and back muscles tight. ? Keep your hips facing the ground. ? Do not arch your back. ? Keep your balance carefully, and do not hold your breath. 4. Hold this position for ______15____ seconds. 5. Slowly return to the starting position. 6. Repeat with your right leg and your left arm. Repeat _____3_____ times. Complete this exercise _____3_____ times a day.   Posture and body mechanics Good posture and healthy body mechanics can help to relieve stress in your body's tissues and joints. Body mechanics refers to the movements and positions of your body while you do your daily activities. Posture is part of body mechanics. Good posture means:  Your spine is in its natural S-curve position (neutral).  Your shoulders are pulled back slightly.  Your head is not tipped forward. Follow these guidelines to improve your posture and body mechanics in your everyday activities. Standing  When standing, keep your spine neutral and your feet about hip width apart. Keep a slight bend in your knees. Your ears, shoulders, and hips should line up.  When you do a task in which you stand in one place for a long time, place one foot up on a stable object that is 2-4 inches (5-10 cm) high, such as a footstool. This helps keep your spine neutral.   Sitting  When sitting, keep your spine  neutral and keep your feet flat on the floor. Use a footrest, if necessary, and keep your thighs parallel to the floor. Avoid rounding your shoulders, and avoid tilting your head forward.  When working at a desk or a computer, keep your desk at a height where your hands are slightly lower than your elbows. Slide your chair under your desk so you are close enough to maintain good posture.  When working at a computer, place your monitor at a height where you are looking straight ahead and you do not have to tilt your head forward or downward to look at the screen.   Resting  When lying down and resting, avoid positions that are most painful for you.  If you have pain with activities such as sitting, bending, stooping, or squatting, lie in a position in which your body does not bend very much. For example, avoid curling up on your side with your arms and knees near your chest (fetal position).  If you have pain with activities such as standing for a long time or reaching with your arms, lie with your spine in a neutral position and bend your knees slightly. Try the following positions: ? Lying on your side with a pillow between your knees. ? Lying on your back with a pillow under your knees. Lifting  When lifting objects, keep your feet at least shoulder width apart and tighten your abdominal muscles.  Bend your knees and hips and keep your spine neutral. It is important to lift using the strength of your legs, not your back. Do not lock your knees straight out.  Always ask for help to lift heavy or awkward objects.   This information is not intended to replace advice given to you by your health care provider. Make sure you discuss any questions you have with your health care provider. Document Revised: 08/11/2018 Document Reviewed: 05/11/2018 Else[employer] Patient Education  2021 [employer]   Sacroiliac Joint  Dysfunction  Sacroiliac joint dysfunction is a condition that causes inflammation on  one or both sides of the sacroiliac (SI) joint. The SI joint is the joint between two bones of the pelvis called the sacrum and the ilium. The sacrum is the bone at the base of the spine. The ilium is the large bone that forms the hip. This condition causes deep aching or burning pain in the low back. In some cases, the pain may also spread into one or both buttocks, hips, or thighs. What are the causes? This condition may be caused by:  Pregnancy. During pregnancy, extra stress is put on the SI joints because the pelvis widens.  Injury, such as: ? Injuries from car crashes. ? Sports-related injuries. ? Work-related injuries.  Having one leg that is shorter than the other.  Conditions that affect the joints, such as: ? Rheumatoid arthritis. ? Gout. ? Psoriatic arthritis. ? Joint infection (septic arthritis). Sometimes, the cause of SI joint dysfunction is not known. What are the signs or symptoms? Symptoms of this condition include:  Aching or burning pain in the lower back. The pain may also spread to other areas, such as: ? Buttocks. ? Groin. ? Thighs.  Muscle spasms in or around the painful areas.  Increased pain when standing, walking, running, stair climbing, bending, or lifting. How is this diagnosed? This condition is diagnosed with a physical exam and your medical history. During the exam, the health care provider may move one or both of your legs to different positions to check for pain. Various tests may be done to confirm the diagnosis, including:  Imaging tests to look for other causes of pain. These may include: ? MRI. ? CT scan. ? Bone scan.  Diagnostic injection. A numbing medicine is injected into the SI joint using a needle. If your pain is temporarily improved or stopped after the injection, this can indicate that SI joint dysfunction is the problem. How is this treated? Treatment depends on the cause and severity of your condition. Treatment options can be  noninvasive and may include:  Ice or heat applied to the lower back area after an injury. This may help reduce pain and muscle spasms.  Medicines to relieve pain or inflammation or to relax the muscles.  Wearing a back brace (sacroiliac brace) to help support the joint while your back is healing.  Physical therapy to increase muscle strength around the joint and flexibility at the joint. This may also involve learning proper body positions and ways of moving to relieve stress on the joint.  Direct manipulation of the SI joint.  Use of a device that provides electrical stimulation to help reduce pain at the joint. Other treatments may include:  Injections of steroid medicine into the joint to reduce pain and swelling.  Radiofrequency ablation. This treatment uses heat to burn away nerves that are carrying pain messages from the joint.  Surgery to put in screws and plates that limit or prevent joint motion. This is rare. Follow these instructions at home: Medicines  Take over-the-counter and prescription medicines only as told by your health care provider.  Ask your health care provider if the medicine prescribed to you: ? Requires you to avoid driving or using machinery. ? Can cause constipation. You may need to take these actions to prevent or treat constipation:  Drink enough fluid to keep your urine pale yellow.  Take over-the-counter or prescription medicines.  Eat foods that are high in fiber, such as beans, whole  grains, and fresh fruits and vegetables.  Limit foods that are high in fat and processed sugars, such as fried or sweet foods. If you have a brace:  Wear the brace as told by your health care provider. Remove it only as told by your health care provider.  Keep the brace clean.  If the brace is not waterproof: ? Do not let it get wet. ? Cover it with a watertight covering when you take a bath or a shower. Managing pain, stiffness, and swelling  Icing can  help with pain and swelling. Heat may help with muscle tension or spasms. Ask your health care provider if you should use ice or heat.  If directed, put ice on the affected area: ? If you have a removable brace, remove it as told by your health care provider. ? Put ice in a plastic bag. ? Place a towel between your skin and the bag. ? Leave the ice on for 20 minutes, 2-3 times a day. ? Remove the ice if your skin turns bright red. This is very important. If you cannot feel pain, heat, or cold, you have a greater risk of damage to the area.  If directed, apply heat to the affected area as often as told by your health care provider. Use the heat source that your health care provider recommends, such as a moist heat pack or a heating pad. ? Place a towel between your skin and the heat source. ? Leave the heat on for 20-30 minutes. ? Remove the heat if your skin turns bright red. This is especially important if you are unable to feel pain, heat, or cold. You may have a greater risk of getting burned.      General instructions  Rest as needed. Return to your normal activities as told by your health care provider. Ask your health care provider what activities are safe for you.  Do exercises as told by your health care provider or physical therapist.  Keep all follow-up visits. This is important. Contact a health care provider if:  Your pain is not controlled with medicine.  You have a fever.  Your pain is getting worse. Get help right away if:  You have weakness, numbness, or tingling in your legs or feet.  You lose control of your bladder or bowels. Summary  Sacroiliac (SI) joint dysfunction is a condition that causes inflammation on one or both sides of the SI joint.  This condition causes deep aching or burning pain in the low back. In some cases, the pain may also spread into one or both buttocks, hips, or thighs.  Treatment depends on the cause and severity of your condition.  It may include medicines to reduce pain and swelling or to relax muscles. This information is not intended to replace advice given to you by your health care provider. Make sure you discuss any questions you have with your health care provider. Document Revised: 08/30/2019 Document Reviewed: 08/30/2019 Else[employer] Patient Education  2021 [employer] Osteoarthritis  Osteoarthritis is a type of arthritis. It refers to joint pain or joint disease. Osteoarthritis affects tissue that covers the ends of bones in joints (cartilage). Cartilage acts as a cushion between the bones and helps them move smoothly. Osteoarthritis occurs when cartilage in the joints gets worn down. Osteoarthritis is sometimes called "wear and tear" arthritis. Osteoarthritis is the most common form of arthritis. It often occurs in older people. It is a condition that gets worse over time. The joints  most often affected by this condition are in the fingers, toes, hips, knees, and spine, including the neck and lower back. What are the causes? This condition is caused by the wearing down of cartilage that covers the ends of bones. What increases the risk? The following factors may make you more likely to develop this condition:  Being age 44 or older.  Obesity.  Overuse of joints.  Past injury of a joint.  Past surgery on a joint.  Family history of osteoarthritis. What are the signs or symptoms? The main symptoms of this condition are pain, swelling, and stiffness in the joint. Other symptoms may include:  An enlarged joint.  More pain and further damage caused by small pieces of bone or cartilage that break off and float inside of the joint.  Small deposits of bone (osteophytes) that grow on the edges of the joint.  A grating or scraping feeling inside the joint when you move it.  Popping or creaking sounds when you move.  Difficulty walking or exercising.  An inability to grip items, twist your hand(s), or  control the movements of your hands and fingers. How is this diagnosed? This condition may be diagnosed based on:  Your medical history.  A physical exam.  Your symptoms.  X-rays of the affected joint(s).  Blood tests to rule out other types of arthritis. How is this treated? There is no cure for this condition, but treatment can help control pain and improve joint function. Treatment may include a combination of therapies, such as:  Pain relief techniques, such as: ? Applying heat and cold to the joint. ? Massage. ? A form of talk therapy called cognitive behavioral therapy (CBT). This therapy helps you set goals and follow up on the changes that you make.  Medicines for pain and inflammation. The medicines can be taken by mouth or applied to the skin. They include: ? NSAIDs, such as ibuprofen. ? Prescription medicines. ? Strong anti-inflammatory medicines (corticosteroids). ? Certain nutritional supplements.  A prescribed exercise program. You may work with a physical therapist.  Assistive devices, such as a brace, wrap, splint, specialized glove, or cane.  A weight control plan.  Surgery, such as: ? An osteotomy. This is done to reposition the bones and relieve pain or to remove loose pieces of bone and cartilage. ? Joint replacement surgery. You may need this surgery if you have advanced osteoarthritis. Follow these instructions at home: Activity  Rest your affected joints as told by your health care provider.  Exercise as told by your health care provider. He or she may recommend specific types of exercise, such as: ? Strengthening exercises. These are done to strengthen the muscles that support joints affected by arthritis. ? Aerobic activities. These are exercises, such as brisk walking or water aerobics, that increase your heart rate. ? Range-of-motion activities. These help your joints move more easily. ? Balance and agility exercises. Managing pain, stiffness,  and swelling  If directed, apply heat to the affected area as often as told by your health care provider. Use the heat source that your health care provider recommends, such as a moist heat pack or a heating pad. ? If you have a removable assistive device, remove it as told by your health care provider. ? Place a towel between your skin and the heat source. If your health care provider tells you to keep the assistive device on while you apply heat, place a towel between the assistive device and the heat source. ?  Leave the heat on for 20-30 minutes. ? Remove the heat if your skin turns bright red. This is especially important if you are unable to feel pain, heat, or cold. You may have a greater risk of getting burned.  If directed, put ice on the affected area. To do this: ? If you have a removable assistive device, remove it as told by your health care provider. ? Put ice in a plastic bag. ? Place a towel between your skin and the bag. If your health care provider tells you to keep the assistive device on during icing, place a towel between the assistive device and the bag. ? Leave the ice on for 20 minutes, 2-3 times a day. ? Move your fingers or toes often to reduce stiffness and swelling. ? Raise (elevate) the injured area above the level of your heart while you are sitting or lying down.      General instructions  Take over-the-counter and prescription medicines only as told by your health care provider.  Maintain a healthy weight. Follow instructions from your health care provider for weight control.  Do not use any products that contain nicotine or tobacco, such as cigarettes, e-cigarettes, and chewing tobacco. If you need help quitting, ask your health care provider.  Use assistive devices as told by your health care provider.  Keep all follow-up visits as told by your health care provider. This is important. Where to find more information  [employer] of Arthritis and  Musculoskeletal and Skin Diseases: www.niams.http://www.myers.net/  [employer] on Aging: https://walker.com/  [employer] of Rheumatology: www.rheumatology.org Contact a health care provider if:  You have redness, swelling, or a feeling of warmth in a joint that gets worse.  You have a fever along with joint or muscle aches.  You develop a rash.  You have trouble doing your normal activities. Get help right away if:  You have pain that gets worse and is not relieved by pain medicine. Summary  Osteoarthritis is a type of arthritis that affects tissue covering the ends of bones in joints (cartilage).  This condition is caused by the wearing down of cartilage that covers the ends of bones.  The main symptom of this condition is pain, swelling, and stiffness in the joint.  There is no cure for this condition, but treatment can help control pain and improve joint function. This information is not intended to replace advice given to you by your health care provider. Make sure you discuss any questions you have with your health care provider. Document Revised: 04/16/2019 Document Reviewed: 04/16/2019 Elsevier Patient Education  2021 [employer].

## 2020-09-23 ENCOUNTER — Other Ambulatory Visit: Payer: Self-pay

## 2020-09-23 ENCOUNTER — Ambulatory Visit: Payer: Self-pay | Admitting: *Deleted

## 2020-09-23 VITALS — BP 147/89 | Ht 66.0 in | Wt 165.0 lb

## 2020-09-23 DIAGNOSIS — Z0000 Encounter for general adult medical examination without abnormal findings: Secondary | ICD-10-CM

## 2020-09-23 DIAGNOSIS — E11649 Type 2 diabetes mellitus with hypoglycemia without coma: Secondary | ICD-10-CM

## 2020-09-23 NOTE — Progress Notes (Signed)
[employer] insurance premium discount evaluation: Labs Drawn. Replacements ROI form signed. Tobacco Free Attestation form signed.  Forms placed in paper chart.  

## 2020-09-24 LAB — CMP12+LP+TP+TSH+6AC+CBC/D/PLT
ALT: 16 IU/L (ref 0–32)
AST: 13 IU/L (ref 0–40)
Albumin/Globulin Ratio: 2.4 — ABNORMAL HIGH (ref 1.2–2.2)
Albumin: 4.7 g/dL (ref 3.8–4.8)
Alkaline Phosphatase: 96 IU/L (ref 44–121)
BUN/Creatinine Ratio: 21 (ref 12–28)
BUN: 13 mg/dL (ref 8–27)
Basophils Absolute: 0.1 10*3/uL (ref 0.0–0.2)
Basos: 1 %
Bilirubin Total: 0.3 mg/dL (ref 0.0–1.2)
Calcium: 9.6 mg/dL (ref 8.7–10.3)
Chloride: 99 mmol/L (ref 96–106)
Chol/HDL Ratio: 6.9 ratio — ABNORMAL HIGH (ref 0.0–4.4)
Cholesterol, Total: 248 mg/dL — ABNORMAL HIGH (ref 100–199)
Creatinine, Ser: 0.61 mg/dL (ref 0.57–1.00)
EOS (ABSOLUTE): 0.2 10*3/uL (ref 0.0–0.4)
Eos: 3 %
Estimated CHD Risk: 2 times avg. — ABNORMAL HIGH (ref 0.0–1.0)
Free Thyroxine Index: 2.5 (ref 1.2–4.9)
GGT: 39 IU/L (ref 0–60)
Globulin, Total: 2 g/dL (ref 1.5–4.5)
Glucose: 269 mg/dL — ABNORMAL HIGH (ref 65–99)
HDL: 36 mg/dL — ABNORMAL LOW (ref >39)
Hematocrit: 47 % — ABNORMAL HIGH (ref 34.0–46.6)
Hemoglobin: 15.8 g/dL (ref 11.1–15.9)
Immature Grans (Abs): 0 10*3/uL (ref 0.0–0.1)
Immature Granulocytes: 1 %
Iron: 95 ug/dL (ref 27–139)
LDH: 155 IU/L (ref 119–226)
LDL Chol Calc (NIH): 140 mg/dL — ABNORMAL HIGH (ref 0–99)
Lymphocytes Absolute: 1.4 10*3/uL (ref 0.7–3.1)
Lymphs: 22 %
MCH: 31.2 pg (ref 26.6–33.0)
MCHC: 33.6 g/dL (ref 31.5–35.7)
MCV: 93 fL (ref 79–97)
Monocytes Absolute: 0.5 10*3/uL (ref 0.1–0.9)
Monocytes: 8 %
Neutrophils Absolute: 4.1 10*3/uL (ref 1.4–7.0)
Neutrophils: 65 %
Phosphorus: 4.1 mg/dL (ref 3.0–4.3)
Platelets: 242 10*3/uL (ref 150–450)
Potassium: 4.3 mmol/L (ref 3.5–5.2)
RBC: 5.07 x10E6/uL (ref 3.77–5.28)
RDW: 12 % (ref 11.7–15.4)
Sodium: 138 mmol/L (ref 134–144)
T3 Uptake Ratio: 28 % (ref 24–39)
T4, Total: 9.1 ug/dL (ref 4.5–12.0)
TSH: 1.6 u[IU]/mL (ref 0.450–4.500)
Total Protein: 6.7 g/dL (ref 6.0–8.5)
Triglycerides: 394 mg/dL — ABNORMAL HIGH (ref 0–149)
Uric Acid: 4.2 mg/dL (ref 3.0–7.2)
VLDL Cholesterol Cal: 72 mg/dL — ABNORMAL HIGH (ref 5–40)
WBC: 6.2 10*3/uL (ref 3.4–10.8)
eGFR: 100 mL/min/{1.73_m2} (ref >59)

## 2020-09-24 LAB — HGB A1C W/O EAG: Hgb A1c MFr Bld: 11.1 % — ABNORMAL HIGH (ref 4.8–5.6)

## 2020-09-24 NOTE — Addendum Note (Signed)
Addended by: Albina Billet A on: 09/24/2020 09:13 AM   Modules accepted: Orders

## 2020-09-27 LAB — SPECIMEN STATUS REPORT

## 2020-09-27 LAB — VITAMIN D 25 HYDROXY (VIT D DEFICIENCY, FRACTURES): Vit D, 25-Hydroxy: 34.9 ng/mL (ref 30.0–100.0)

## 2020-10-21 ENCOUNTER — Ambulatory Visit: Payer: Self-pay | Admitting: Registered Nurse

## 2020-10-21 ENCOUNTER — Other Ambulatory Visit: Payer: Self-pay

## 2020-10-21 ENCOUNTER — Encounter: Payer: Self-pay | Admitting: Registered Nurse

## 2020-10-21 VITALS — BP 129/79 | HR 74 | Temp 97.5°F

## 2020-10-21 DIAGNOSIS — B351 Tinea unguium: Secondary | ICD-10-CM

## 2020-10-21 DIAGNOSIS — F13239 Sedative, hypnotic or anxiolytic dependence with withdrawal, unspecified: Secondary | ICD-10-CM

## 2020-10-21 DIAGNOSIS — F13939 Sedative, hypnotic or anxiolytic use, unspecified with withdrawal, unspecified: Secondary | ICD-10-CM

## 2020-10-21 DIAGNOSIS — R002 Palpitations: Secondary | ICD-10-CM

## 2020-10-21 MED ORDER — TEA TREE OIL OIL: 1.0000 "application " | TOPICAL_OIL | Freq: Every day | 0 refills | Status: AC

## 2020-10-21 MED ORDER — MICONAZOLE NITRATE 2 % EX POWD: CUTANEOUS | 0 refills | Status: AC | PRN

## 2020-10-21 MED ORDER — AQUAPHOR EX OINT: TOPICAL_OINTMENT | CUTANEOUS | 0 refills | Status: AC | PRN

## 2020-10-21 NOTE — Patient Instructions (Addendum)
Diazepam Tablets What is this medication? DIAZEPAM (dye AZ e pam) treats seizures, muscle spasms or twitches. It may also be used to treat anxiety, including before a procedure. It can also be used to reduce the symptoms of alcohol withdrawal. It works by helping your nervoussystem calm down. It belongs to a group of medications called benzodiazepines. This medicine may be used for other purposes; ask your health care provider orpharmacist if you have questions. COMMON BRAND NAME(S): Valium What should I tell my care team before I take this medication? They need to know if you have any of these conditions An alcohol or drug abuse problem Bipolar disorder, depression, psychosis or other mental health condition Glaucoma Kidney or liver disease Lung or breathing disease Myasthenia gravis Parkinson's disease Seizures or a history of seizures Suicidal thoughts An unusual or allergic reaction to diazepam, other benzodiazepines, foods, dyes, or preservatives Pregnant or trying to get pregnant Breast-feeding How should I use this medication? Take this medication by mouth with a glass of water. Follow the directions on the prescription label. If this medication upsets your stomach, take it with food or milk. Take your doses at regular intervals. Do not take your medication more often than directed. If you have been taking this medication regularly for some time, do not suddenly stop taking it. You must gradually reduce the dose, or you may get severe side effects. Ask your care team for advice. Even afteryou stop taking this medication it can still affect your body for several days. A special MedGuide will be given to you by the pharmacist with eachprescription and refill. Be sure to read this information carefully each time. Talk to your care team regarding the use of this medication in children.Special care may be needed. Overdosage: If you think you have taken too much of this medicine contact  apoison control center or emergency room at once. NOTE: This medicine is only for you. Do not share this medicine with others. What if I miss a dose? If you miss a dose, take it as soon as you can. If it is almost time for yournext dose, take only that dose. Do not take double or extra doses. What may interact with this medication? Do not take this medication with any of the following: Narcotic medications for cough Sodium oxybate This medication may also interact with the following: Alcohol Antacids Antihistamines for allergy, cough and cold Certain antibiotics like clarithromycin, erythromycin, rifampin Certain medications for anxiety or sleep Certain medications for blood pressure, heart disease, irregular heartbeat Certain medications for depression, like amitriptyline, fluoxetine, sertraline, tranylcypromine Certain medications for fungal infections like ketoconazole, itraconazole, clotrimazole Certain medications for psychotic disturbances Certain medications for seizures like carbamazepine, phenobarbital, phenytoin, primidone, valproate Cimetidine Cyclosporine Dexamethasone General anesthetics like lidocaine, pramoxine, tetracaine MAOIs like Carbex, Eldepryl, Marplan, Nardil, and Parnate Medications that relax muscles for surgery Narcotic medications for pain Omeprazole Paclitaxel Phenothiazines like chlorpromazine, mesoridazine, prochlorperazine, thioridazine Theophylline Warfarin This list may not describe all possible interactions. Give your health care provider a list of all the medicines, herbs, non-prescription drugs, or dietary supplements you use. Also tell them if you smoke, drink alcohol, or use illegaldrugs. Some items may interact with your medicine. What should I watch for while using this medication? Tell your care team if your symptoms do not start to get better or if they getworse. Do not stop taking except on your care team's advice. You may develop a  severereaction. Your care team will tell you how much medication to  take. You may get drowsy or dizzy. Do not drive, use machinery, or do anything that needs mental alertness until you know how this medication affects you. To reduce the risk of dizzy and fainting spells, do not stand or sit up quickly, especially if you are an older patient. Alcohol may increase dizziness anddrowsiness. Avoid alcoholic drinks. If you are taking another medication that also causes drowsiness, you may have more side effects. Give your care team a list of all medications you use. Your care team will tell you how much medication to take. Do not take more medication than directed. Call emergency services if you have problemsbreathing or unusual sleepiness. What side effects may I notice from receiving this medication? Side effects that you should report to your care team as soon as possible: Allergic reactions-skin rash, itching, hives, swelling of the face, lips, tongue, or throat CNS depression-slow or shallow breathing, shortness of breath, feeling faint, dizziness, confusion, trouble staying awake Thoughts of suicide or self-harm, worsening mood, feelings of depression Side effects that usually do not require medical attention (report to your careteam if they continue or are bothersome): Dizziness Drowsiness Headache This list may not describe all possible side effects. Call your doctor for medical advice about side effects. You may report side effects to FDA at1-800-FDA-1088. Where should I keep my medication? Keep out of the reach of children and pets. This medication can be abused. Keep your medication in a safe place to protect it from theft. Do not share this medication with anyone. Selling or giving away this medication is dangerous andagainst the law. Store at room temperature between 15 and 30 degrees C (59 and 86 degrees F).Protect from light. Keep container tightly closed. This medication may cause harm and  death if it is taken by other adults, children, or pets. It is important to get rid of the medication as soon as youno longer need it, or it is expired. You can do this in two ways: Take the medication to a medication take-back program. Check with your pharmacy or law enforcement to find a location. If you cannot return the medication, check the label or package insert to see if the medication should be thrown out in the garbage or flushed down the toilet. If you are not sure, ask your care team. If it is safe to put it in the trash, pour the medication out of the container. Mix the medication with cat litter, dirt, coffee grounds, or other unwanted substance. Seal the mixture in a bag or container. Put it in the trash. NOTE: This sheet is a summary. It may not cover all possible information. If you have questions about this medicine, talk to your doctor, pharmacist, orhealth care provider.  2022 Elsevier/Gold Standard (2020-05-16 12:19:04) Fungal Nail Infection A fungal nail infection is a common infection of the toenails or fingernails. This condition affects toenails more often than fingernails. It often affects the great, or big, toes. More than one nail may be infected. The condition can be passed from person to person (is contagious). What are the causes? This condition is caused by a fungus. Several types of fungi can cause the infection. These fungi are common in moist and warm areas. If your hands or feet come into contact with the fungus, it may get into a crack in yourfingernail or toenail and cause the infection. What increases the risk? The following factors may make you more likely to develop this condition: Being female. Being of older age. Living with  someone who has the fungus. Walking barefoot in areas where the fungus thrives, such as showers or locker rooms. Wearing shoes and socks that cause your feet to sweat. Having a nail injury or a recent nail surgery. Having certain medical  conditions, such as: Athlete's foot. Diabetes. Psoriasis. Poor circulation. A weak body defense system (immune system). What are the signs or symptoms? Symptoms of this condition include: A pale spot on the nail. Thickening of the nail. A nail that becomes yellow or brown. A brittle or ragged nail edge. A crumbling nail. A nail that has lifted away from the nail bed. How is this diagnosed? This condition is diagnosed with a physical exam. Your health care provider maytake a scraping or clipping from your nail to test for the fungus. How is this treated? Treatment is not needed for mild infections. If you have significant nail changes, treatment may include: Antifungal medicines taken by mouth (orally). You may need to take the medicine for several weeks or several months, and you may not see the results for a long time. These medicines can cause side effects. Ask your health care provider what problems to watch for. Antifungal nail polish or nail cream. These may be used along with oral antifungal medicines. Laser treatment of the nail. Surgery to remove the nail. This may be needed for the most severe infections. It can take a long time, usually up to a year, for the infection to go away.The infection may also come back. Follow these instructions at home: Medicines Take or apply over-the-counter and prescription medicines only as told by your health care provider. Ask your health care provider about using over-the-counter mentholated ointment on your nails. Nail care Trim your nails often. Wash and dry your hands and feet every day. Keep your feet dry: Wear absorbent socks, and change your socks frequently. Wear shoes that allow air to circulate, such as sandals or canvas tennis shoes. Throw out old shoes. Do not use artificial nails. If you go to a nail salon, make sure you choose one that uses clean instruments. Use antifungal foot powder on your feet and in your shoes. General  instructions Do not share personal items, such as towels or nail clippers. Do not walk barefoot in shower rooms or locker rooms. Wear rubber gloves if you are working with your hands in wet areas. Keep all follow-up visits as told by your health care provider. This is important. Contact a health care provider if: Your infection is not getting better or it is getting worse after severalmonths. Summary A fungal nail infection is a common infection of the toenails or fingernails. Treatment is not needed for mild infections. If you have significant nail changes, treatment may include taking medicine orally and applying medicine to your nails. It can take a long time, usually up to a year, for the infection to go away. The infection may also come back. Take or apply over-the-counter and prescription medicines only as told by your health care provider. Follow instructions for taking care of your nails to help prevent infection from coming back or spreading. This information is not intended to replace advice given to you by your health care provider. Make sure you discuss any questions you have with your healthcare provider. Document Revised: 08/10/2018 Document Reviewed: 09/23/2017 Elsevier Patient Education  2022 [employer] Benzodiazepine Withdrawal Benzodiazepines are prescription medicines that decrease the activity of (depress) the central nervous system and cause changes in certain brain chemicals (neurotransmitters). There are many types  of benzodiazepines. Some benzodiazepines take effect quickly and stay in your body for a short amount of time (short-acting). Other benzodiazepines require more time to take effect and stay in your body for longer amounts of time (long-acting). The five most commonly prescribed benzodiazepines are: Alprazolam. Lorazepam. Clonazepam. Diazepam. Temazepam. Withdrawal is a group of physical and mental symptoms that can happen when yousuddenly stop taking a  medicine. What are the causes? When you take benzodiazepines, your brain needs more and more of the medicine over time to get the same effects from it. This increased need is called tolerance. As you develop a tolerance, your brain adapts to the effects of the benzodiazepine and relies on these effects. This is called dependency. Withdrawal happens when you suddenly stop taking your medicine. This does notgive your brain enough time to adapt to not having the medicine. What increases the risk? You are more likely to develop this condition if you: Have taken benzodiazepines for more than 1-2 weeks. Have developed a tolerance for benzodiazepines. Have developed a dependence on benzodiazepines. Take high dosages of benzodiazepines. Take doses of benzodiazepines that are higher than prescribed. Take benzodiazepines without a prescription. Use benzodiazepines with other substances that depress the central nervous system, such as alcohol. Have a history of drug or alcohol abuse. What are the signs or symptoms? Symptoms of this condition may include: Difficulty sleeping. Anxiety. Restlessness. Irritability. Muscle aches. Involuntary shaking or trembling of a body part (tremor). Confusion and poor concentration. Vomiting. Sweating. Headaches. Feeling or seeing things that are not there (hallucinations). Seizures. Symptoms of withdrawal from short-acting benzodiazepines may develop 1-2 daysafter you stop taking your medicine, and they may last for 2-4 weeks or longer. Symptoms of withdrawal from long-acting benzodiazepines may develop 2-7 daysafter you stop taking your medicine, and they may last for 2-8 weeks or longer. How is this diagnosed? This condition may be diagnosed based on: Your symptoms. A physical exam. Your health care provider may check for: Rapid heartbeat. Rapid breathing. Tremors. High blood pressure. Blood tests. Urine tests. Your alcohol and drug habits. Your  medical history. How is this treated? Treatment usually involves starting you on a safe and stable dose of a benzodiazepine and then slowly lowering your dosage over time (tapered withdrawal). This may be done at a hospital or a treatment center. Treatment for this condition depends on: Your symptoms. The type of benzodiazepine you have been taking. How long you have been taking benzodiazepines. Long-term treatment for this condition may involve medicine, counseling, andsupport groups. Follow these instructions at home:  Take over-the-counter and prescription medicines only as told by your health care provider. Check with your health care provider before starting new medicines. Keep all follow-up visits as told by your health care provider. This is important. How is this prevented? Do not take any benzodiazepines without a prescription. Do not take more than your prescribed dosage. Do not mix benzodiazepines with alcohol or other medicines. Do not stop taking benzodiazepines without speaking with your health care provider. Contact a health care provider if you: Are not able to take your medicines as told by your health care provider. Have symptoms that get worse. Develop withdrawal symptoms during your tapered withdrawal. Develop a craving for drugs or alcohol. Experience withdrawal again (relapse). Get help right away if you: Have a seizure. Become very confused. Lose consciousness. Have difficulty breathing. Have serious thoughts about hurting yourself or someone else. If you ever feel like you may hurt yourself or others, or have thoughts  about taking your own life, get help right away. You can go to your nearest emergency department or call: Your local emergency services (911 in the [place] A suicide crisis helpline, such as the National Suicide Prevention Lifeline at 540-621-1669. This is open 24 hours a day. Summary Withdrawal is a group of physical and mental symptoms that  can happen when you suddenly stop taking a benzodiazepine. Treatment usually involves starting on a safe and stable dose of a benzodiazepine and then slowly lowering the dosage over time. The type of treatment depends on your symptoms, how long you were taking the medicine, and which type of medicine you were taking. Long-term treatment may include medicine, counseling, and support groups. This information is not intended to replace advice given to you by your health care provider. Make sure you discuss any questions you have with your healthcare provider. Document Revised: 01/31/2018 Document Reviewed: 01/04/2018 Elsevier Patient Education  2022 [employer] Inc. Athlete's Foot Athlete's foot (tinea pedis) is a fungal infection of the skin on your feet. It often occurs on the skin that is between or underneath the toes. It can also occur on the soles of your feet. The infection can spread from person to person (is contagious). It can also spread when a person's bare feet come in contact with the funguson shower floors or on items such as shoes. What are the causes? This condition is caused by a fungus that grows in warm, moist places. You can get athlete's foot by sharing shoes, shower stalls, towels, and wet floors with someone who is infected. Not washing your feet or changing your socks oftenenough can also lead to athlete's foot. What increases the risk? This condition is more likely to develop in: Men. People who have a weak body defense system (immune system). People who have diabetes. People who use public showers, such as at a gym. People who wear heavy-duty shoes, such as Youth worker. Seasons with warm, humid weather. What are the signs or symptoms? Symptoms of this condition include: Itchy areas between your toes or on the soles of your feet. White, flaky, or scaly areas between your toes or on the soles of your feet. Very itchy small blisters between your toes or on  the soles of your feet. Small cuts in your skin. These cuts can become infected. Thick or discolored toenails. How is this diagnosed? This condition may be diagnosed with a physical exam and a review of your medical history. Your health care provider may also take a skin or toenailsample to examine under a microscope. How is this treated? This condition is treated with antifungal medicines. These may be applied as powders, ointments, or creams. In severe cases, an oral antifungal medicine maybe given. Follow these instructions at home: Medicines Apply or take over-the-counter and prescription medicines only as told by your health care provider. Apply your antifungal medicine as told by your health care provider. Do not stop using the antifungal even if your condition improves. Foot care Do not scratch your feet. Keep your feet dry: Wear cotton or wool socks. Change your socks every day or if they become wet. Wear shoes that allow air to flow, such as sandals or canvas tennis shoes. Wash and dry your feet, including the area between your toes. Also, wash and dry your feet: Every day or as told by your health care provider. After exercising. General instructions Do not let others use towels, shoes, nail clippers, or other personal items that touch your  feet. Protect your feet by wearing sandals in wet areas, such as locker rooms and shared showers. Keep all follow-up visits as told by your health care provider. This is important. If you have diabetes, keep your blood sugar under control. Contact a health care provider if: You have a fever. You have swelling, soreness, warmth, or redness in your foot. Your feet are not getting better with treatment. Your symptoms get worse. You have new symptoms. Summary Athlete's foot (tinea pedis) is a fungal infection of the skin on your feet. It often occurs on skin that is between or underneath the toes. This condition is caused by a fungus that  grows in warm, moist places. Symptoms include white, flaky, or scaly areas between your toes or on the soles of your feet. This condition is treated with antifungal medicines. Keep your feet clean. Always dry them thoroughly. This information is not intended to replace advice given to you by your health care provider. Make sure you discuss any questions you have with your healthcare provider. Document Revised: 12/06/2019 Document Reviewed: 12/06/2019 Elsevier Patient Education  2022 [employer] Palpitations Palpitations are feelings that your heartbeat is irregular or is faster than normal. It may feel like your heart is fluttering or skipping a beat. Palpitations are usually not a serious problem. They may be caused by many things, including smoking, caffeine, alcohol, stress, and certain medicines or drugs. Most causes of palpitations are not serious. However, some palpitations can be a sign of a serious problem. You may need further tests to rule outserious medical problems. Follow these instructions at home:     Pay attention to any changes in your condition. Take these actions to helpmanage your symptoms: Eating and drinking Avoid foods and drinks that may cause palpitations. These may include: Caffeinated coffee, tea, soft drinks, diet pills, and energy drinks. Chocolate. Alcohol. Lifestyle Take steps to reduce your stress and anxiety. Things that can help you relax include: Yoga. Mind-body activities, such as deep breathing, meditation, or using words and images to create positive thoughts (guided imagery). Physical activity, such as swimming, jogging, or walking. Tell your health care provider if your palpitations increase with activity. If you have chest pain or shortness of breath with activity, do not continue the activity until you are seen by your health care provider. Biofeedback. This is a method that helps you learn to use your mind to control things in your body, such as  your heartbeat. Do not use drugs, including cocaine or ecstasy. Do not use marijuana. Get plenty of rest and sleep. Keep a regular bed time. General instructions Take over-the-counter and prescription medicines only as told by your health care provider. Do not use any products that contain nicotine or tobacco, such as cigarettes and e-cigarettes. If you need help quitting, ask your health care provider. Keep all follow-up visits as told by your health care provider. This is important. These may include visits for further testing if palpitations do not go away or get worse. Contact a health care provider if you: Continue to have a fast or irregular heartbeat after 24 hours. Notice that your palpitations occur more often. Get help right away if you: Have chest pain or shortness of breath. Have a severe headache. Feel dizzy or you faint. Summary Palpitations are feelings that your heartbeat is irregular or is faster than normal. It may feel like your heart is fluttering or skipping a beat. Palpitations may be caused by many things, including smoking, caffeine, alcohol, stress,  certain medicines, and drugs. Although most causes of palpitations are not serious, some causes can be a sign of a serious medical problem. Get help right away if you faint or have chest pain, shortness of breath, a severe headache, or dizziness. This information is not intended to replace advice given to you by your health care provider. Make sure you discuss any questions you have with your healthcare provider. Document Revised: 06/01/2017 Document Reviewed: 06/01/2017 Elsevier Patient Education  2022 [employer].

## 2020-10-21 NOTE — Progress Notes (Signed)
Subjective:    Patient ID: Sara Weaver, female    DOB: December 03, 1955, 65 y.o.   MRN: [id number]  64y/o Caucasian established female pt presenting to clinic reporting that both of her great toenails are yellow. Sts she just noticed this this weekend when she was outside in sunlight and can't see the color difference in the lights of her home. Unsure how long they have been yellow. Wears same pair of shoes daily at work and slippers at home.  Doesn't have second pair to rotate.  She doesn't wear nailpolish.  Stopped taking her ozempic, simvastatin, and valium.  Valium was not by her choice as ran out and no refills remaining. Contacted her pharmacy for multiple drug refills and her other requested medication refilled but not valium.  She stated her son had similar problem told he has to see his mental health provider from now on for refills of those medications. Still taking neurontin and vicodin.  RN Rolly Salter noted during pulse check, noted that pulse was irregularly irregular. Pt denies any sx such as chest pain, ShOB, palpitations, lightheadedness/dizziness. Advised she talk to her pcp about this and get an EKG. She reports she is planning to schedule an appt in the next 2 weeks as she requested a refill of her valium and they refused it. She has been off this for 3-4 days now, cold [place].   Patient last labs for [employer] discount at work 09/23/20 and Hgba1c 11.1     Review of Systems  Constitutional:  Negative for activity change, appetite change, chills, diaphoresis, fatigue and fever.  HENT:  Negative for trouble swallowing and voice change.   Eyes:  Negative for photophobia and visual disturbance.  Respiratory:  Negative for cough, chest tightness, shortness of breath, wheezing and stridor.   Cardiovascular:  Negative for chest pain, palpitations and leg swelling.  Gastrointestinal:  Negative for abdominal pain, diarrhea, nausea and vomiting.  Endocrine: Negative for cold intolerance  and heat intolerance.  Genitourinary:  Negative for difficulty urinating.  Musculoskeletal:  Negative for gait problem, neck pain and neck stiffness.  Skin:  Negative for rash.  Allergic/Immunologic: Negative for food allergies.  Neurological:  Positive for tremors and headaches. Negative for dizziness, seizures, syncope, facial asymmetry, speech difficulty, weakness, light-headedness and numbness.  Hematological:  Negative for adenopathy. Does not bruise/bleed easily.  Psychiatric/Behavioral:  Negative for agitation, confusion and sleep disturbance.       Objective:   Physical Exam Vitals and nursing note reviewed.  Constitutional:      General: She is awake. She is not in acute distress.    Appearance: Normal appearance. She is well-developed, well-groomed and overweight. She is not ill-appearing, toxic-appearing or diaphoretic.  HENT:     Head: Normocephalic and atraumatic.     Jaw: There is normal jaw occlusion.     Salivary Glands: Right salivary gland is not diffusely enlarged. Left salivary gland is not diffusely enlarged.     Right Ear: Hearing and external ear normal.     Left Ear: Hearing and external ear normal.     Nose: Nose normal. No congestion or rhinorrhea.     Mouth/Throat:     Lips: Pink. No lesions.     Mouth: Mucous membranes are moist.     Pharynx: Oropharynx is clear.  Eyes:     General: Lids are normal. Vision grossly intact. Gaze aligned appropriately. No scleral icterus.       Right eye: No discharge.  Left eye: No discharge.     Extraocular Movements: Extraocular movements intact.     Conjunctiva/sclera: Conjunctivae normal.     Pupils: Pupils are equal, round, and reactive to light.  Neck:     Trachea: Trachea and phonation normal. No tracheal deviation.  Cardiovascular:     Rate and Rhythm: Normal rate. Rhythm irregularly irregular.     Pulses: Normal pulses.          Radial pulses are 2+ on the right side and 2+ on the left side.     Heart  sounds: Normal heart sounds, S1 normal and S2 normal. No murmur heard.   No friction rub. No gallop.  Pulmonary:     Effort: Pulmonary effort is normal. No respiratory distress.     Breath sounds: Normal breath sounds and air entry. No stridor or transmitted upper airway sounds. No wheezing, rhonchi or rales.     Comments: Spoke full sentences without difficulty; no cough observed in exam room; wearing mask due to covid 19 pandemic Abdominal:     General: Abdomen is flat. There is no distension.     Palpations: Abdomen is soft.  Musculoskeletal:        General: No swelling, tenderness, deformity or signs of injury. Normal range of motion.     Right shoulder: No swelling, laceration or crepitus. Normal strength.     Left shoulder: No swelling, laceration or crepitus. Normal strength.     Right elbow: No swelling or lacerations. Normal range of motion.     Left elbow: No swelling or lacerations. Normal range of motion.     Right hand: No swelling or lacerations. Normal range of motion. Normal sensation. Normal capillary refill.     Left hand: No swelling. Normal range of motion. Normal sensation. Normal capillary refill.     Cervical back: Normal range of motion and neck supple. No swelling, edema, deformity, erythema, signs of trauma, lacerations, rigidity or crepitus.     Thoracic back: No swelling, edema, deformity, signs of trauma or lacerations. Normal range of motion.     Lumbar back: No swelling, edema, signs of trauma or lacerations. Normal range of motion.     Right lower leg: No edema.     Left lower leg: No edema.     Right ankle: No swelling, deformity, ecchymosis or lacerations. No tenderness. Normal range of motion.     Left ankle: No swelling, deformity, ecchymosis or lacerations. No tenderness. Normal range of motion.     Right foot: Normal range of motion and normal capillary refill. No swelling, deformity, bunion, Charcot foot, foot drop, prominent metatarsal heads,  laceration, tenderness, bony tenderness or crepitus.     Left foot: Normal range of motion and normal capillary refill. No swelling, deformity, bunion, Charcot foot, foot drop, prominent metatarsal heads, laceration, tenderness, bony tenderness or crepitus.  Feet:     Right foot:     Skin integrity: Callus and dry skin present. No ulcer, blister, skin breakdown, erythema or fissure.     Toenail Condition: Fungal disease present.    Left foot:     Skin integrity: Callus and dry skin present. No ulcer, blister, skin breakdown, erythema or fissure.     Toenail Condition: Fungal disease present.    Comments: Some debris noted between toes; scaling moccasin pattern bilateral feet; bilateral great toenails yellowed thickened and tips distal yellow toes 2&3 only; normal 4&5 toenails; callous soles bilateral feet Lymphadenopathy:     Head:  Right side of head: No submandibular or preauricular adenopathy.     Left side of head: No submandibular or preauricular adenopathy.     Cervical: No cervical adenopathy.     Right cervical: No superficial cervical adenopathy.    Left cervical: No superficial cervical adenopathy.  Skin:    General: Skin is warm and dry.     Capillary Refill: Capillary refill takes less than 2 seconds.     Coloration: Skin is not ashen, cyanotic, jaundiced, mottled, pale or sallow.     Findings: Rash present. No abrasion, abscess, acne, bruising, burn, ecchymosis, erythema, signs of injury, laceration, lesion, petechiae or wound. Rash is scaling. Rash is not crusting, macular, nodular, papular, purpuric, pustular, urticarial or vesicular.       Neurological:     General: No focal deficit present.     Mental Status: She is alert and oriented to person, place, and time. Mental status is at baseline.     GCS: GCS eye subscore is 4. GCS verbal subscore is 5. GCS motor subscore is 6.     Cranial Nerves: No cranial nerve deficit, dysarthria or facial asymmetry.     Sensory: No  sensory deficit.     Motor: Motor function is intact. No weakness, tremor, atrophy, abnormal muscle tone or seizure activity.     Coordination: Coordination is intact. Coordination normal.     Gait: Gait is intact. Gait normal.     Comments: In/out of chair without difficulty; gait sure and steady in clinic; bilateral hand grasp equal 5/5  Psychiatric:        Attention and Perception: Attention and perception normal.        Mood and Affect: Mood and affect normal.        Speech: Speech normal.        Behavior: Behavior normal. Behavior is cooperative.        Thought Content: Thought content normal.        Cognition and Memory: Cognition and memory normal.        Judgment: Judgment normal.      Called PCM office while patient in clinic but provider and nurse not available left message patient has run out of her valium 4 days ago, having palpitations/withdrawal symptoms and needs follow up. I requested a return call to discuss patient visit today.  Discussed with patient due to contract limitations I am unable to prescribe behavioral health medications or controlled substances in this clinic and she will need to follow up with her PCM but typically it is not recommended to stop valium after long term use suddenly but taper off.  Given exitcare handout printed on benzodiazepine withdrawal and palpitations electronically to her preferred email.  Patient has been noncompliant with diabetes/cholesterol medication also and this could be confounding symptoms.  Discussed with patient due to new palpitations recommend EKG which is not available at this clinic.  If unable to schedule with PCM in next 24 hours or if worsening symptoms I recommend she goes to ER for evaluation.  Patient denied dizziness/visual changes/headache/chest pain/dyspnea today.  In the past has had intermittent dyspnea and hand tremors but she believes this was due to her blood sugar being low.  She does not check her blood sugar on any  regular basis and typically not at home only doctor's office.  Discussed with patient I will be on vacation after Thursday clinic this week.  Message left for patient this evening to see if [facility]CM office contacted her  or her pharmacy today.    Assessment & Plan:   A-onychomycosis bilateral great toes initial visit; Benzodiazepine withdrawal, palpitations, Type II Diabetes uncontrolled  P-Thickened toenails with yellowing c/w onychomycosis  Patient going to try TLC and OTC therapy first.  Discussed toenails grow slow may take months of therapy up to or exceeding a year when entire toenail involved.  Sometimes oral therapy is needed.  If no improvement consider podiatry also. Discussed antifungal powder in footwear. Rotating footwear and placing footwear in sun to dry out between use.  Do not wear same pair of shoes every day.  Applying tea tree oil to nail daily alternative plan of care/holistic.  Patient did not want Rx for penlac today.  Discussed to do foot checks daily and apply emollient like aquaphor or vaseline.  Given 4 UD aquaphor from stock today to apply to scaling areas of feet.   Caution as can sometimes cause skin irritation.  Penlac Rx alternative but expensive. Exitcare handout on onychomycosis printed and given to patient.  Patient verbalized understanding and agreed with plan of care and had no further questions at this time.   Ran out of valium to follow up with PCM  Given exitcare handout on benzodiazepine withdrawal.  Suspect palpitations due to abrupt cessation of valium but could be other causes e.g. history dyslipidemia/diabetes.  If worsening to go to ER today for further evaluation as no EKG available in this clinic or stat labs onsite.  VSS.  Patient verbalized understanding information/instructions, agreed with plan of care and had no further questions at this time.  Patient to restart her cholesterol and diabetes medications due to uncontrolled diabetes and cholesterol worsens  risk for MI/CVA.  Patient stated she has read bad things happening when taking these medications.  Discussed with patient alternative is having disease progress faster and complications of diabetes/blood vessel plaques/clogging causing heart attack or stroke/neuropathy.  Patient stated she has medications at home and will restart use.  Patient verbalized understanding information/instructions, agreed with plan of care and had no further questions at this time.

## 2020-10-22 ENCOUNTER — Telehealth: Payer: Self-pay | Admitting: Registered Nurse

## 2020-10-22 DIAGNOSIS — F13939 Sedative, hypnotic or anxiolytic use, unspecified with withdrawal, unspecified: Secondary | ICD-10-CM

## 2020-10-22 DIAGNOSIS — F13239 Sedative, hypnotic or anxiolytic dependence with withdrawal, unspecified: Secondary | ICD-10-CM

## 2020-10-22 DIAGNOSIS — R002 Palpitations: Secondary | ICD-10-CM

## 2020-10-23 NOTE — Telephone Encounter (Signed)
Patient returned call stated headache today otherwise feeling well.  Spoke with [facility] nurse yesterday after 5pm and they said Rx was sent in to her pharmacy.  [facility] hasn't contacted patient yet to say Rx ready for pick up.  Discussed with patient I called PCM office this morning and left another message.  Appt clerk notified me that office tried to contact patient but no answer and voicemail full.  Patient A&Ox3 at work today respirations even and unlabored spoke full sentences without difficulty.  No cough/congestion/throat clearing.  Patient to call [facility] office today if pharmacy states prescription not received.  Patient verbalized understanding information/instructions, agreed with plan of care and had no further questions at this time.

## 2020-10-23 NOTE — Telephone Encounter (Signed)
Reviewed patient PMP [place] information  10/01/2020 10/01/2020 1  Hydrocodone-Acetamin 5-325 Mg 120.00 30 Ty D a [id number] Ede (0252) 0/0 20.00 MME Comm Ins [place] 09/17/2020 06/04/2020 1  Diazepam 5 Mg Tablet 60.00 30 Ty D a [id number] Ede (0252) 2/2 1.00 LME Comm Ins [place] 09/02/2020 09/02/2020 1  Hydrocodone-Acetamin 5-325 Mg 120.00 30 Ty D a [id number] Ede (0252) 0/0 20.00 MME Comm Ins [place] 08/19/2020 06/04/2020 1  Diazepam 5 Mg Tablet 60.00 30 Ty D a [id number] Ede (0252) 1/2 1.00 LME Comm Ins [place] 08/04/2020 08/04/2020 1  Hydrocodone-Acetamin 5-325 Mg 120.00 30 Ty D a [id number] Ede (0252) 0/0 20.00 MME Comm Ins [place] 07/23/2020 06/04/2020 1  Diazepam 5 Mg Tablet 60.00 30 Ty D a [id number] Ede (0252) 0/2 1.00 LME Comm Ins [place] 07/03/2020 07/03/2020 1  Hydrocodone-Acetamin 5-325 Mg 120.00 30 Pa Ohe [id number] Ede (0252) 0/0 20.00 MME Comm Ins [place] 06/23/2020 04/17/2020 1  Diazepam 5 Mg Tablet 60.00 30 Ty D a [id number] Ede (0252) 2/2 1.00 LME Comm Ins [place] 06/04/2020 06/04/2020 1  Hydrocodone-Acetamin 5-325 Mg 120.00 30 Ty D a [id number] Ede (0252) 0/0 20.00 MME Comm Ins [place] 05/25/2020 04/17/2020 1  Diazepam 5 Mg Tablet 60.00 30 Ty D a [id number] Ede (0252) 1/2 1.00 LME Comm Ins [place] 05/07/2020 05/07/2020 1  Hydrocodone-Acetamin 5-325 Mg 120.00 30 Ty D a [id number] Ede (0252) 0/0 20.00 MME Comm Ins [place] 04/22/2020 04/17/2020 1  Diazepam 5 Mg Tablet 60.00 30 Ty D a 6767209 Ede (0252) 0/2 1.00 LME Comm Ins [place] 04/09/2020 04/09/2020 1  Hydrocodone-Acetamin 5-325 Mg 120.00 30 Ty D a [id number] Ede (0252) 0/0 20.00 MME Comm Ins [place] 03/18/2020 01/10/2020 1  Diazepam 5 Mg Tablet 60.00 30 Ty D a [id number] Ede (0252) 2/2 1.00 LME Comm Ins [place] 03/10/2020 03/10/2020 1  Hydrocodone-Acetamin 5-325 Mg 120.00 30 Ty D a 6546503 Ede (0252) 0/0 20.00 MME Comm Ins [place] 03/10/2020 03/10/2020 1  Hydrocodone-Acetamin 5-325 Mg 120.00 30 Ty D a [id number] Ede (0252) 0/0 20.00 MME Comm Ins [place] 02/19/2020 01/10/2020 1  Diazepam 5 Mg Tablet 60.00 30 Ty D  a 2751700 Ede (0252) 1/2 1.00 LME Comm Ins [place] 02/08/2020 02/08/2020 1  Hydrocodone-Acetamin 5-325 Mg 120.00 30 Ty D a [id number] Ede (0252) 0/0 20.00 MME Comm Ins [place] 01/22/2020 01/10/2020 1  Diazepam 5 Mg Tablet 60.00 30 Ty D a [id number] Ede (0252) 0/2 1.00 LME Comm Ins [place] 01/10/2020 01/10/2020 1  Hydrocodone-Acetamin 5-325 Mg 120.00 30 Ty D a [id number] Ede (0252) 0/0 20.00 MME Comm Ins [place] 12/23/2019 10/10/2019 1  Diazepam 5 Mg Tablet 60.00 30 Ty D a [id number] Ede (0252) 2/2 1.00 LME Comm Ins [place] 12/08/2019 12/08/2019 1  Hydrocodone-Acetamin 5-325 Mg 120.00 30 [place] [id number] Ede (0252) 0/0 20.00 MME Comm Ins [place] 11/23/2019 10/10/2019 1  Diazepam 5 Mg Tablet 60.00 30 Ty D a [id number] Ede (0252) 1/2 1.00 LME Comm Ins [place] 11/07/2019 11/07/2019 1  Hydrocodone-Acetamin 5-325 Mg 120.00 30 Ty D a [id number] Ede (0252) 0/0 20.00 MME Comm Ins [place] 10/25/2019 10/10/2019 1  Diazepam 5 Mg Tablet 60.00 30 Ty D a [id number] Ede (0252) 0/2 1.00 LME Comm Ins [place] 10/10/2019 10/10/2019 1  Hydrocodone-Acetamin 5-325 Mg 120.00 30 Ty D a [id number] Ede (0252) 0/0 20.00 MME Comm Ins [place] 09/27/2019 07/10/2019 1  Diazepam 5 Mg Tablet 60.00 30 Ty D a 2876811 Ede (0252) 2/2 1.00 LME Comm Ins [place] 09/06/2019 09/06/2019 1  Hydrocodone-Acetamin 5-325 Mg 120.00 30 Ty D a [id number] Ede (0252)  0/0 20.00 MME Comm Ins [place] 08/30/2019 07/10/2019 1  Diazepam 5 Mg Tablet 60.00 30 Ty D a [id number] Ede (0252) 1/2 1.00 LME Comm Ins [place] 08/10/2019 08/10/2019 1  Hydrocodone-Acetamin 5-325 Mg 120.00 30 Ty D a [id number] Ede (0252) 0/0 20.00 MME Comm Ins [place] 08/02/2019 07/10/2019 1  Diazepam 5 Mg Tablet 60.00 30 Ty D a [id number] Ede (0252) 0/2 1.00 LME Comm Ins [place] 07/10/2019 07/10/2019 1  Hydrocodone-Acetamin 5-325 Mg 120.00 30 Ty D a [id number] Ede (0252) 0/0 20.00 MME Comm Ins [place] 07/05/2019 05/11/2019 2  Diazepam 5 Mg Tablet 60.00 30 Ty D a [id number] Wal (0019) 1/2 1.00 LME Comm Ins [place] 06/11/2019 06/11/2019 1  Hydrocodone-Acetamin 5-325 Mg 120.00 30 Ty D  a [id number] Ede (0252) 0/0 20.00 MME Comm Ins [place] 06/06/2019 05/11/2019 2  Diazepam 5 Mg Tablet 60.00 30 Ty D a 785885 Wal (0019) 0/2 1.00 LME Comm Ins [place] 05/13/2019 05/11/2019 2  Hydrocodone-Acetamin 5-325 Mg 120.00 30 Ty D a [id number] Wal (0019) 0/0 20.00 MME Comm Ins [place] 05/07/2019 04/08/2019 1  Diazepam 5 Mg Tablet 60.00 30 Ed Haw 2878676 Ede (0252) 1/1 1.00 LME Comm Ins [place] 04/16/2019 04/16/2019 1  Hydrocodone-Acetamin 5-325 Mg 115.00 29 Ed Haw [id number] Ede (0252) 0/0 19.83 MME Comm Ins [place] 04/08/2019 04/08/2019 1  Diazepam 5 Mg Tablet 60.00 30 Ed Haw [id number] Ede (0252) 0/1 1.00 LME Comm Ins [place] 03/19/2019 03/19/2019 1  Hydrocodone-Acetamin 5-325 Mg 115.00 29 Ed Haw [id number] Ede (0252) 0/0 19.83 MME Comm Ins [place] 03/08/2019 03/07/2019 1  Diazepam 5 Mg Tablet 60.00 30 Ed Haw [id number] Ede [phone]) 0/0 1.00 LME Comm Ins [place] 02/20/2019 02/20/2019 1  Hydrocodone-Acetamin 5-325 Mg 115.00 29 Ed Haw [id number] Ede [phone]) 0/0 19.83 MME Comm Ins [place] 02/08/2019 02/08/2019 1  Diazepam 5 Mg Tablet 60.00 30 Ed Haw [id number] Ede [phone] 0/0 1.00 LME Comm Ins [place] 01/22/2019 01/22/2019 1  Hydrocodone-Acetamin 5-325 Mg 115.00 29 Ed Haw [id number] Ede [phone]) 0/0 19.83 MME Comm Ins [place] 01/08/2019 01/08/2019 1  Diazepam 5 Mg Tablet 60.00 30 Ed Haw [id number] Ede [phone]) 0/0 1.00 LME Comm Ins [place] 12/25/2018 12/25/2018 1  Hydrocodone-Acetamin 5-325 Mg 115.00 29 Ed Haw [id number] Ede [phone]) 0/0 19.83 MME Comm Ins [place] 12/05/2018 12/05/2018 1  Diazepam 5 Mg Tablet 60.00 30 Ed Haw [id number] Ede [phone]) 0/0 1.00 LME Comm Ins [place] 11/27/2018 11/27/2018 1  Hydrocodone-Acetamin 5-325 Mg 115.00 29 Ed Haw [id number] Ede [phone]) 0/0 19.83 MME Comm Ins [place] 11/07/2018 11/07/2018 1  Diazepam 5 Mg Tablet 60.00 30 Ed Haw [id number] Ede [phone]) 0/0 1.00 LME Comm Ins [place] 10/30/2018 10/30/2018 1  Hydrocodone-Acetamin 5-325 Mg 115.00 29 Ed Haw [id number] Ede [phone]) 0/0 19.83 MME Comm Ins  Pershing Proud, Msn [address] [place] [address] [phone] Marene Lenz [address] [place] [address] [phone] Kari Baars [address] [place] [address] [phone] Glendell Docker [address] [place] [address] [phone]

## 2020-12-12 ENCOUNTER — Other Ambulatory Visit: Payer: Self-pay

## 2020-12-12 ENCOUNTER — Ambulatory Visit: Payer: Self-pay | Admitting: *Deleted

## 2020-12-12 ENCOUNTER — Encounter: Payer: Self-pay | Admitting: *Deleted

## 2020-12-12 DIAGNOSIS — H1031 Unspecified acute conjunctivitis, right eye: Secondary | ICD-10-CM

## 2020-12-12 MED ORDER — CARBOXYMETHYLCELLULOSE SODIUM 1 % OP SOLN: 2.0000 [drp] | Freq: Three times a day (TID) | OPHTHALMIC | 12 refills | Status: AC

## 2020-12-12 MED ORDER — KETOTIFEN FUMARATE 0.025 % OP SOLN: 1.0000 [drp] | Freq: Two times a day (BID) | OPHTHALMIC | 0 refills | Status: AC

## 2020-12-12 MED ORDER — ERYTHROMYCIN 5 MG/GM OP OINT: 1.0000 "application " | TOPICAL_OINTMENT | Freq: Four times a day (QID) | OPHTHALMIC | 0 refills | Status: AC

## 2020-12-12 NOTE — Progress Notes (Signed)
Pt in to clinic c/o R eye drainage, watering, pain and itching that she noticed upon waking this morning. Denies known injury to eye. Residual white to clear discharge noted along last lines R eye. Conjunctiva inflamed, reddened. Eye actively watering. Pt only wears glasses for reading. No contact use. Discussed case with BP Tina by phone. Orders received for Erythromycin ointment ophthalmic 0.5% instill 1cm ribbon left eye at least BID up to QID x 7 days #1 RF0, dispensed from [facility] stock. Sent to pharmacy refresh drops and ketotifen eye drops. Pt not taking an antihistamine daily.  Reviewed home care with pt for conjunctivitis including washing hands before applying any eye medicine and afterwards also. Do not touch medicine containers to eye. Instructed patient to not rub eyes. May need to wash pillowcases more frequently until infection resolves if discharge noted on pillow. Discussed to see optometrist same day if visual field loss, fever/chills and/or orbital swelling.  Call or return to clinic as needed if these symptoms worsen or fail to improve as anticipated.

## 2020-12-12 NOTE — Progress Notes (Signed)
Case discussed with RN Rolly Salter via telephone.  Reviewed RN Rolly Salter note agreed with plan of care.  Orders signed electronically.  Patient to follow up with another provider for reevaluation this weekend if worsening visual acuity, orbital swelling/pain/erythema, fever/chills or headache.

## 2020-12-17 NOTE — Telephone Encounter (Signed)
Patient seen in clinic 12/12/20 by RN Rolly Salter for conjunctivitis no questions or concerns regarding Rx.  Closing encounter.

## 2020-12-18 ENCOUNTER — Telehealth: Payer: Self-pay | Admitting: Registered Nurse

## 2020-12-18 DIAGNOSIS — E0800 Diabetes mellitus due to underlying condition with hyperosmolarity without nonketotic hyperglycemic-hyperosmolar coma (NKHHC): Secondary | ICD-10-CM

## 2020-12-18 DIAGNOSIS — R002 Palpitations: Secondary | ICD-10-CM

## 2021-02-11 NOTE — Telephone Encounter (Signed)
Attempted to speak with patient 02/10/21 but not at desk

## 2021-02-19 NOTE — Telephone Encounter (Signed)
Spoke with patient at desk.  She stated she had labs at [facility] and Hgba1c was still in the 10s.  PCM started her on metformin with farxiga.  She stopped metformin due to leg pain at work and it has improved/resolved over a couple of days.  She has appt with PCM this afternoon to follow up.  Encouraged patient to take her medications every day, keep follow up with PCM and repeat Hgba1c in 3 months.  Patient verbalized understanding information/instructions, agreed with plan of care and had no further questions at this time.

## 2021-05-20 ENCOUNTER — Other Ambulatory Visit: Payer: Self-pay | Admitting: Nurse Practitioner

## 2021-05-20 DIAGNOSIS — Z1231 Encounter for screening mammogram for malignant neoplasm of breast: Secondary | ICD-10-CM

## 2021-05-26 ENCOUNTER — Ambulatory Visit
Admission: RE | Admit: 2021-05-26 | Discharge: 2021-05-26 | Disposition: A | Discharge status: Home / Self Care | Payer: No Typology Code available for payment source | Source: Ambulatory Visit | Attending: Nurse Practitioner | Admitting: Nurse Practitioner

## 2021-05-26 ENCOUNTER — Other Ambulatory Visit: Payer: Self-pay

## 2021-05-26 DIAGNOSIS — Z1231 Encounter for screening mammogram for malignant neoplasm of breast: Secondary | ICD-10-CM

## 2022-06-08 DIAGNOSIS — Z1211 Encounter for screening for malignant neoplasm of colon: Secondary | ICD-10-CM | POA: Diagnosis not present

## 2022-06-08 DIAGNOSIS — E1169 Type 2 diabetes mellitus with other specified complication: Secondary | ICD-10-CM | POA: Diagnosis not present

## 2022-06-08 DIAGNOSIS — E1142 Type 2 diabetes mellitus with diabetic polyneuropathy: Secondary | ICD-10-CM | POA: Diagnosis not present

## 2022-06-08 DIAGNOSIS — Z6826 Body mass index (BMI) 26.0-26.9, adult: Secondary | ICD-10-CM | POA: Diagnosis not present

## 2022-06-08 DIAGNOSIS — F17210 Nicotine dependence, cigarettes, uncomplicated: Secondary | ICD-10-CM | POA: Diagnosis not present

## 2022-06-08 DIAGNOSIS — E663 Overweight: Secondary | ICD-10-CM | POA: Diagnosis not present

## 2022-06-08 DIAGNOSIS — I700 Atherosclerosis of aorta: Secondary | ICD-10-CM | POA: Diagnosis not present

## 2022-06-08 DIAGNOSIS — Z79899 Other long term (current) drug therapy: Secondary | ICD-10-CM | POA: Diagnosis not present

## 2022-06-08 DIAGNOSIS — G2581 Restless legs syndrome: Secondary | ICD-10-CM | POA: Diagnosis not present

## 2022-06-08 DIAGNOSIS — E785 Hyperlipidemia, unspecified: Secondary | ICD-10-CM | POA: Diagnosis not present

## 2022-06-08 DIAGNOSIS — Z008 Encounter for other general examination: Secondary | ICD-10-CM | POA: Diagnosis not present

## 2022-06-08 DIAGNOSIS — F419 Anxiety disorder, unspecified: Secondary | ICD-10-CM | POA: Diagnosis not present

## 2022-06-08 DIAGNOSIS — Z79891 Long term (current) use of opiate analgesic: Secondary | ICD-10-CM | POA: Diagnosis not present

## 2023-07-04 DIAGNOSIS — E119 Type 2 diabetes mellitus without complications: Secondary | ICD-10-CM | POA: Diagnosis not present

## 2024-01-05 ENCOUNTER — Other Ambulatory Visit: Payer: Self-pay | Admitting: Internal Medicine

## 2024-01-05 DIAGNOSIS — Z122 Encounter for screening for malignant neoplasm of respiratory organs: Secondary | ICD-10-CM

## 2024-01-05 DIAGNOSIS — Z1231 Encounter for screening mammogram for malignant neoplasm of breast: Secondary | ICD-10-CM

## 2024-05-11 NOTE — Progress Notes (Signed)
 Sara Weaver                                          MRN: [id number]   05/11/2024   The [facility]BCI Quality Team Specialist reviewed this patient medical record for the purposes of chart review for care gap closure. The following were reviewed: chart review for care gap closure-glycemic status assessment.    VBCI Quality Team
